# Patient Record
Sex: Male | Born: 1965
Health system: Southern US, Community
[De-identification: ages and names within clinical notes are randomized; demographics above are authoritative.]

## PROBLEM LIST (undated history)

## (undated) DIAGNOSIS — M1990 Unspecified osteoarthritis, unspecified site: Secondary | ICD-10-CM

## (undated) DIAGNOSIS — E119 Type 2 diabetes mellitus without complications: Secondary | ICD-10-CM

## (undated) HISTORY — PX: HYDROCELE EXCISION / REPAIR: SUR1145

## (undated) HISTORY — PX: HERNIA REPAIR: SHX51

## (undated) HISTORY — PX: FINGER SURGERY: SHX640

## (undated) HISTORY — PX: KNEE SURGERY: SHX244

---

## 1999-02-08 ENCOUNTER — Emergency Department (HOSPITAL_COMMUNITY): Admission: EM | Admit: 1999-02-08 | Discharge: 1999-02-08 | Payer: Self-pay | Admitting: Emergency Medicine

## 2005-09-27 ENCOUNTER — Ambulatory Visit: Payer: Self-pay | Admitting: Orthopedic Surgery

## 2006-05-30 ENCOUNTER — Ambulatory Visit (HOSPITAL_COMMUNITY): Admission: RE | Admit: 2006-05-30 | Discharge: 2006-05-30 | Payer: Self-pay | Admitting: Family Medicine

## 2018-10-19 ENCOUNTER — Encounter (HOSPITAL_COMMUNITY): Payer: Self-pay | Admitting: Emergency Medicine

## 2018-10-19 ENCOUNTER — Other Ambulatory Visit: Payer: Self-pay

## 2018-10-19 ENCOUNTER — Ambulatory Visit (HOSPITAL_COMMUNITY)
Admission: EM | Admit: 2018-10-19 | Discharge: 2018-10-19 | Disposition: A | Discharge status: Home / Self Care | Payer: Managed Care, Other (non HMO) | Attending: Family Medicine | Admitting: Family Medicine

## 2018-10-19 DIAGNOSIS — N390 Urinary tract infection, site not specified: Secondary | ICD-10-CM | POA: Insufficient documentation

## 2018-10-19 DIAGNOSIS — M1990 Unspecified osteoarthritis, unspecified site: Secondary | ICD-10-CM | POA: Insufficient documentation

## 2018-10-19 DIAGNOSIS — M546 Pain in thoracic spine: Secondary | ICD-10-CM | POA: Diagnosis not present

## 2018-10-19 DIAGNOSIS — F17200 Nicotine dependence, unspecified, uncomplicated: Secondary | ICD-10-CM | POA: Diagnosis not present

## 2018-10-19 DIAGNOSIS — Z79899 Other long term (current) drug therapy: Secondary | ICD-10-CM | POA: Insufficient documentation

## 2018-10-19 DIAGNOSIS — M549 Dorsalgia, unspecified: Secondary | ICD-10-CM

## 2018-10-19 HISTORY — DX: Unspecified osteoarthritis, unspecified site: M19.90

## 2018-10-19 LAB — POCT URINALYSIS DIP (DEVICE)
Glucose, UA: 100 mg/dL — AB
KETONES UR: NEGATIVE mg/dL
LEUKOCYTES UA: NEGATIVE
NITRITE: NEGATIVE
PH: 6 (ref 5.0–8.0)
Protein, ur: 100 mg/dL — AB
SPECIFIC GRAVITY, URINE: 1.025 (ref 1.005–1.030)
Urobilinogen, UA: 2 mg/dL — ABNORMAL HIGH (ref 0.0–1.0)

## 2018-10-19 MED ORDER — CEPHALEXIN 500 MG PO CAPS: 500.0000 mg | ORAL_CAPSULE | Freq: Four times a day (QID) | ORAL | 0 refills | Status: AC

## 2018-10-19 NOTE — ED Provider Notes (Signed)
[facility]    CSN: [id number] Arrival date & time: 10/19/18  1421     History   Chief Complaint Chief Complaint  Patient presents with  . Urinary Tract Infection    HPI Hunter King is a 52 y.o. male.   Hunter King presents with complaints of painful urination, body aches and back pain which started two days ago. Urinary urgency which causes some incontinence as unable to get to bathroom quickly enough. Darker urine but no obvious blood in urine. No abdominal pain. Temp has been in the 99's. Back aches after urination and does improve. History of back pain but this feels different. Has taken tylenol and gabapentin which have minimally helped. Burning to urethra after urination. Pain to back is 4/10, at times does hurt with movement. No new numbness or tingling. No saddle paresthesia. No radiation of pain. Denies any previous similar. Stopped taking his lasix at onset of symptoms to prevent further urgency.     ROS per HPI.      Past Medical History:  Diagnosis Date  . Arthritis     There are no active problems to display for this patient.   Past Surgical History:  Procedure Laterality Date  . FINGER SURGERY Right   . HERNIA REPAIR    . HYDROCELE EXCISION / REPAIR    . KNEE SURGERY Right        Home Medications    Prior to Admission medications   Medication Sig Start Date End Date Taking? Authorizing Provider  acetaminophen (TYLENOL) 325 MG tablet Take 650 mg by mouth every 6 (six) hours as needed.   Yes [provider]  furosemide (LASIX) 20 MG tablet Take 20 mg by mouth.   Yes [provider]  gabapentin (NEURONTIN) 100 MG capsule Take 100 mg by mouth 3 (three) times daily.   Yes [provider]  mupirocin ointment (BACTROBAN) 2 % Place 1 application into the nose 2 (two) times daily.   Yes [provider]  potassium chloride (KLOR-CON) 20 MEQ packet Take 40 mEq by mouth 2 (two) times daily.   Yes [provider]  cephALEXin (KEFLEX) 500 MG capsule Take 1 capsule (500 mg total) by mouth 4 (four) times daily for 7 days. 10/19/18 10/26/18  Georgetta Haber, NP    Family History Family History  Problem Relation Age of Onset  . Fibromyalgia Mother     Social History Social History   Tobacco Use  . Smoking status: Current Every Day Smoker  Substance Use Topics  . Alcohol use: Yes  . Drug use: Never     Allergies   Ibuprofen and Naproxen   Review of Systems Review of Systems   Physical Exam Triage Vital Signs ED Triage Vitals  Enc Vitals Group     BP 10/19/18 1558 (!) 174/90     Pulse Rate 10/19/18 1558 79     Resp 10/19/18 1558 18     Temp 10/19/18 1558 98.6 F (37 C)     Temp Source 10/19/18 1558 Oral     SpO2 10/19/18 1558 96 %     Weight --      Height --      Head Circumference --      Peak Flow --      Pain Score 10/19/18 1551 4     Pain Loc --      Pain Edu? --      Excl. in GC? --    No  data found.  Updated Vital Signs BP (!) 174/90 (BP Location: Right Arm) Comment: regular , forearm  Pulse 79   Temp 98.6 F (37 C) (Oral)   Resp 18   SpO2 96%    Physical Exam  Constitutional: He is oriented to person, place, and time. He appears well-developed and well-nourished.  Cardiovascular: Normal rate and regular rhythm.  Pulmonary/Chest: Effort normal and breath sounds normal.  Abdominal: Soft. There is no tenderness. There is no rigidity, no rebound, no guarding, no CVA tenderness, no tenderness at McBurney's point and negative Murphy's sign.  Denies scrotal redness, swelling, pain; denies sores or lesions  Musculoskeletal:       Lumbar back: He exhibits tenderness, bony tenderness and pain. He exhibits normal range of motion, no swelling, no edema, no deformity, no laceration, no spasm and normal pulse.       Back:  Proximal lumbar, distal thoracic back with generalized tenderness on palpation; limited exam due to patient body habitus; gross  sensation intact to lower extremities; no weakness to lower extremities  Neurological: He is alert and oriented to person, place, and time.  Skin: Skin is warm and dry.     UC Treatments / Results  Labs (all labs ordered are listed, but only abnormal results are displayed) Labs Reviewed  POCT URINALYSIS DIP (DEVICE) - Abnormal; Notable for the following components:      Result Value   Glucose, UA 100 (*)    Bilirubin Urine SMALL (*)    Hgb urine dipstick SMALL (*)    Protein, ur 100 (*)    Urobilinogen, UA 2.0 (*)    All other components within normal limits  URINE CULTURE    EKG None  Radiology No results found.  Procedures Procedures (including critical care time)  Medications Ordered in UC Medications - No data to display  Initial Impression / Assessment and Plan / UC Course  I have reviewed the triage vital signs and the nursing notes.  Pertinent labs & imaging results that were available during my care of the patient were reviewed by me and considered in my medical decision making (see chart for details).     Urinary symptoms, hgb in urine, no leuks or nitrite. Culture pending. Will treat at this time with keflex pending culture. Low suspicion for pyelonephritis at this time. Acute on chronic back pain. Encouraged close follow up for recheck with pcp. Return precautions provided. Patient verbalized understanding and agreeable to plan.   Final Clinical Impressions(s) / UC Diagnoses   Final diagnoses:  Lower urinary tract infectious disease  Mid back pain     Discharge Instructions     Complete course of antibiotics.  Drink plenty of water to empty bladder regularly. Avoid alcohol and caffeine as these may irritate the bladder.   I have sent your urine to be cultured to ensure appropriate treatment with antibiotics.  May continue with previously prescribed medications for back pain.  Please follow up with your primary care provider for recheck in the next  week.  If develop worsening of pain, fevers, vomiting, weakness, numbness or tingling to legs or otherwise worsening please return or go to the Er.    ED Prescriptions    Medication Sig Dispense Auth. Provider   cephALEXin (KEFLEX) 500 MG capsule Take 1 capsule (500 mg total) by mouth 4 (four) times daily for 7 days. 28 capsule Georgetta Haber, NP     Controlled Substance Prescriptions [place] Controlled Substance Registry consulted? Not Applicable  Georgetta Haber, NP 10/19/18 [phone]

## 2018-10-19 NOTE — ED Triage Notes (Signed)
Lower, right back soreness thought to be muscle soreness.  On Friday patient noticed pain with urination.  Pain worsened.

## 2018-10-19 NOTE — Discharge Instructions (Signed)
Complete course of antibiotics.  Drink plenty of water to empty bladder regularly. Avoid alcohol and caffeine as these may irritate the bladder.   I have sent your urine to be cultured to ensure appropriate treatment with antibiotics.  May continue with previously prescribed medications for back pain.  Please follow up with your primary care provider for recheck in the next week.  If develop worsening of pain, fevers, vomiting, weakness, numbness or tingling to legs or otherwise worsening please return or go to the Er.

## 2018-10-21 ENCOUNTER — Telehealth (HOSPITAL_COMMUNITY): Payer: Self-pay

## 2018-10-21 LAB — URINE CULTURE: Culture: 10000 — AB

## 2018-10-21 NOTE — Telephone Encounter (Signed)
Urine culture positive for E.coli. This was treated with Keflex at ucc visit. Patient called and made aware.

## 2018-12-29 DIAGNOSIS — Z1389 Encounter for screening for other disorder: Secondary | ICD-10-CM | POA: Diagnosis not present

## 2018-12-29 DIAGNOSIS — I890 Lymphedema, not elsewhere classified: Secondary | ICD-10-CM | POA: Diagnosis not present

## 2018-12-29 DIAGNOSIS — Z6841 Body Mass Index (BMI) 40.0 and over, adult: Secondary | ICD-10-CM | POA: Diagnosis not present

## 2018-12-29 DIAGNOSIS — E119 Type 2 diabetes mellitus without complications: Secondary | ICD-10-CM | POA: Diagnosis not present

## 2018-12-29 DIAGNOSIS — Z713 Dietary counseling and surveillance: Secondary | ICD-10-CM | POA: Diagnosis not present

## 2019-01-02 ENCOUNTER — Encounter: Payer: Self-pay | Admitting: Podiatry

## 2019-01-02 ENCOUNTER — Ambulatory Visit (INDEPENDENT_AMBULATORY_CARE_PROVIDER_SITE_OTHER): Payer: PRIVATE HEALTH INSURANCE | Admitting: Podiatry

## 2019-01-02 VITALS — BP 131/79 | HR 83 | Resp 16

## 2019-01-02 DIAGNOSIS — E1149 Type 2 diabetes mellitus with other diabetic neurological complication: Secondary | ICD-10-CM | POA: Diagnosis not present

## 2019-01-02 DIAGNOSIS — I890 Lymphedema, not elsewhere classified: Secondary | ICD-10-CM | POA: Diagnosis not present

## 2019-01-02 DIAGNOSIS — L853 Xerosis cutis: Secondary | ICD-10-CM | POA: Diagnosis not present

## 2019-01-02 NOTE — Progress Notes (Signed)
Subjective:    Patient ID: Hunter King, male    DOB: August 29, 1966, 53 y.o.   MRN: [id number]  HPI 53 year old male presents the office today just in diabetic shoes and for diabetic foot evaluation.  He states that he has an dry, cracking skin to the right heel which is been on over the last 10 years.  Denies any drainage or pus or increase in swelling.  He still had the same issue in the left side.  He also has chronic lymphedema on both legs.  He was seen his primary care physician but no significant treatment for his legs daily that his sister does.  He was on antibiotics previously for concerns of possible infection.  Has any history of open sores.  He is diabetic his last A1c was 6.9.   Review of Systems  All other systems reviewed and are negative.  Past Medical History:  Diagnosis Date  . Arthritis     Past Surgical History:  Procedure Laterality Date  . FINGER SURGERY Right   . HERNIA REPAIR    . HYDROCELE EXCISION / REPAIR    . KNEE SURGERY Right      Current Outpatient Medications:  .  acetaminophen (TYLENOL) 325 MG tablet, Take 650 mg by mouth every 6 (six) hours as needed., Disp: , Rfl:  .  furosemide (LASIX) 20 MG tablet, Take 20 mg by mouth., Disp: , Rfl:  .  furosemide (LASIX) 40 MG tablet, TK 1 T PO D, Disp: , Rfl:  .  gabapentin (NEURONTIN) 100 MG capsule, Take 100 mg by mouth 3 (three) times daily., Disp: , Rfl:  .  mupirocin ointment (BACTROBAN) 2 %, Place 1 application into the nose 2 (two) times daily., Disp: , Rfl:  .  potassium chloride (K-DUR) 10 MEQ tablet, TK 1 T PO D, Disp: , Rfl:  .  potassium chloride (KLOR-CON) 20 MEQ packet, Take 40 mEq by mouth 2 (two) times daily., Disp: , Rfl:  .  SSD 1 % cream, APPLY A 1/16 INCH THICK LAYER TO ENTIRE BUM AREA BID, Disp: , Rfl:   Allergies  Allergen Reactions  . Ibuprofen Swelling  . Naproxen Swelling         Objective:   Physical Exam  General: AAO x3, NAD  Dermatological: Skin lymphedema changes  are present the bilateral legs with the right side worse than left.  There is some superficial areas of skin breakdown but there is no erythema warmth there is no purulence but there is some clear drainage expressed.  There is no flexion crepitation or any malodor present.  Vascular: Dorsalis Pedis artery and Posterior Tibial artery pedal pulses are 1/4 bilateral with immedate capillary fill time. There is no pain with calf compression, swelling, warmth, erythema.   Neruologic: Grossly intact via light touch bilateral.Protective threshold with Semmes Wienstein monofilament intact to all pedal sites bilateral.  Musculoskeletal: No gross boney pedal deformities bilateral. No pain, crepitus, or limitation noted with foot and ankle range of motion bilateral. Muscular strength 5/5 in all groups tested bilateral.    Assessment & Plan:  53 year old male with chronic significant lymphedema bilaterally; 2 diabetes with neuropathy. -Treatment options discussed including all alternatives, risks, and complications -Etiology of symptoms were discussed -I do think that he needs to be referred to the lymphedema clinic in [place].  Referral placed today. Unna boot were applied today.  Precautions were advised on when to remove it.  Were to try to set up home health for  him as well. -Moisturizer daily to the feet.  Do not apply interdigitally. -He will benefit more from a custom shoe. -Follow-up with Raiford Nobleick or Betha in order to get measured for custom shoes.  I think that given the swelling he is in need this as opposed to a regular diabetic shoe.  Vivi BarrackMatthew R Wagoner DPM

## 2019-01-05 ENCOUNTER — Telehealth: Payer: Self-pay | Admitting: *Deleted

## 2019-01-05 DIAGNOSIS — R609 Edema, unspecified: Secondary | ICD-10-CM

## 2019-01-05 DIAGNOSIS — I890 Lymphedema, not elsewhere classified: Secondary | ICD-10-CM

## 2019-01-05 NOTE — Telephone Encounter (Signed)
[facility] - Renea Ee states they go to [place] and accept [employer]. Faxed orders to [facility].

## 2019-01-05 NOTE — Telephone Encounter (Signed)
-----   Message from Vivi Barrack, DPM sent at 01/02/2019 10:51 AM EST ----- Can you please order arterial and venous studies due to swelling Also home health care to do unna boots 3 times a week. Can we also put in a referral for the lymphedema clinic in [place]?   Thanks.

## 2019-01-05 NOTE — Telephone Encounter (Signed)
Faxed Lymphedema Therapy orders to [facility]. Seg Multi and Venous orders to [facility] through Epic orders.

## 2019-01-06 NOTE — Telephone Encounter (Signed)
done

## 2019-01-07 ENCOUNTER — Telehealth: Payer: Self-pay | Admitting: Podiatry

## 2019-01-07 NOTE — Telephone Encounter (Signed)
[facility]roseF[name] and [facility] copy to pt, noting that our office was unable to contact him by the currently listed phone number

## 2019-01-07 NOTE — Telephone Encounter (Signed)
Pt is supposed to have home health nurse coming out to change his Unna boot but due to his work schedule he is unable to schedule a time with the nurse. Pt states his sister has been changing the unna boot and would like to continue helping but the pt is unsure of where he can get more supplies. Please give pt a call.

## 2019-01-07 NOTE — Telephone Encounter (Signed)
Called pt's listed phone number and there was no ring or dial tone.

## 2019-01-07 NOTE — Telephone Encounter (Signed)
[employer] states has UNNA V with zinc for $11.48.

## 2019-01-15 ENCOUNTER — Other Ambulatory Visit: Payer: BLUE CROSS/BLUE SHIELD | Admitting: Orthotics

## 2019-01-15 ENCOUNTER — Ambulatory Visit: Payer: PRIVATE HEALTH INSURANCE | Admitting: Podiatry

## 2019-01-15 ENCOUNTER — Other Ambulatory Visit: Payer: PRIVATE HEALTH INSURANCE | Admitting: Orthotics

## 2019-01-15 ENCOUNTER — Ambulatory Visit (INDEPENDENT_AMBULATORY_CARE_PROVIDER_SITE_OTHER): Payer: BLUE CROSS/BLUE SHIELD | Admitting: Podiatry

## 2019-01-15 DIAGNOSIS — M2142 Flat foot [pes planus] (acquired), left foot: Secondary | ICD-10-CM | POA: Diagnosis not present

## 2019-01-15 DIAGNOSIS — I890 Lymphedema, not elsewhere classified: Secondary | ICD-10-CM

## 2019-01-15 DIAGNOSIS — E1149 Type 2 diabetes mellitus with other diabetic neurological complication: Secondary | ICD-10-CM | POA: Diagnosis not present

## 2019-01-15 DIAGNOSIS — M2141 Flat foot [pes planus] (acquired), right foot: Secondary | ICD-10-CM | POA: Diagnosis not present

## 2019-01-22 ENCOUNTER — Ambulatory Visit (HOSPITAL_COMMUNITY): Payer: BLUE CROSS/BLUE SHIELD | Attending: Podiatry | Admitting: Physical Therapy

## 2019-01-22 ENCOUNTER — Other Ambulatory Visit: Payer: Self-pay

## 2019-01-22 DIAGNOSIS — I890 Lymphedema, not elsewhere classified: Secondary | ICD-10-CM | POA: Diagnosis not present

## 2019-01-22 DIAGNOSIS — S81801S Unspecified open wound, right lower leg, sequela: Secondary | ICD-10-CM

## 2019-01-22 DIAGNOSIS — S81802S Unspecified open wound, left lower leg, sequela: Secondary | ICD-10-CM | POA: Insufficient documentation

## 2019-01-22 DIAGNOSIS — M79604 Pain in right leg: Secondary | ICD-10-CM | POA: Diagnosis not present

## 2019-01-22 DIAGNOSIS — M79605 Pain in left leg: Secondary | ICD-10-CM

## 2019-01-22 NOTE — Therapy (Signed)
[facility] [address] [place], [place], [address] Phone: [phone]   Fax:  [phone]  Physical Therapy Evaluation  Patient Details  Name: Myrna Blazerdward W Greenleaf MRN: [id number] Date of Birth: [birth year] Referring Provider (PT): Ovid CurdMatthew Wagoner   Encounter Date: 01/22/2019  PT End of Session - 01/22/19 1059    Visit Number  1    Number of Visits  30    Date for PT Re-Evaluation  04/02/19    Authorization - Visit Number  1    Authorization - Number of Visits  30    PT Start Time  0815    PT Stop Time  1020    PT Time Calculation (min)  125 min    Activity Tolerance  Patient tolerated treatment well    Behavior During Therapy  [facility] for tasks assessed/performed       Past Medical History:  Diagnosis Date  . Arthritis     Past Surgical History:  Procedure Laterality Date  . FINGER SURGERY Right   . HERNIA REPAIR    . HYDROCELE EXCISION / REPAIR    . KNEE SURGERY Right     There were no vitals filed for this visit.   Subjective Assessment - 01/22/19 0833    Subjective  PT states that he has had swelling in both of his legs for about 10  years, he states that it runs all the way thru her family.  He states that his legs had unbelievable sores on them; he started on unna boots on 01/02/2019 and noted a great difference.  He is now being referred to skilled therapy for lymphedema services.     Pertinent History  DM, back issues, Rt heel plantarfascitis     How long can you sit comfortably?  no problem     How long can you stand comfortably?  back limiting 10 minutes     How long can you walk comfortably?  back limited     Currently in Pain?  No/denies   Not from lymphedema; back issues and Rt heel we will not be looking at this at this time.          [facility] Assessment - 01/22/19 0001      Assessment   Medical Diagnosis  B LE Lymphedema    Referring Provider (PT)  Marga MelnickMatthew Wagnor    Onset Date/Surgical Date  --   chronic   Next MD Visit  not scheduled     Prior Therapy  unna boot       Precautions   Precautions  None      Restrictions   Weight Bearing Restrictions  No      Balance Screen   Has the patient fallen in the past 6 months  No    Has the patient had a decrease in activity level because of a fear of falling?   Yes    Is the patient reluctant to leave their home because of a fear of falling?   No      Home Public house managernvironment   Living Environment  Private residence      Prior Function   Level of Independence  Independent    Vocation  Full time employment    Vocation Requirements  sedentary       Cognition   Overall Cognitive Status  Within Functional Limits for tasks assessed      Observation/Other Assessments   Focus on Therapeutic Outcomes (FOTO)   --  life impact survey:  [id number]       LYMPHEDEMA/ONCOLOGY QUESTIONNAIRE - 01/22/19 0815      What other symptoms do you have   Are you Having Heaviness or Tightness  Yes    Are you having Pain  No    Are you having pitting edema  Yes    Body Site  LE    Is it Hard or Difficult finding clothes that fit  Yes      Lymphedema Stage   Stage  STAGE 3 ELEPHANTIASIS  (Pended)       Lymphedema Assessments   Lymphedema Assessments  Lower extremities  (Pended)       Right Lower Extremity Lymphedema   20 cm Proximal to Suprapatella  103.8 cm  (Pended)     10 cm Proximal to Suprapatella  100.5 cm  (Pended)     At Midpatella/Popliteal Crease  59.3 cm  (Pended)     30 cm Proximal to Floor at Lateral Plantar Foot  64.8 cm  (Pended)     20 cm Proximal to Floor at Lateral Plantar Foot  56.8 1  (Pended)     10 cm Proximal to Floor at Lateral Malleoli  43 cm  (Pended)     Circumference of ankle/heel  42 cm.  (Pended)     5 cm Proximal to 1st MTP Joint  30.5 cm  (Pended)     Across MTP Joint  27.8 cm  (Pended)     Around Proximal Great Toe  10.1 cm  (Pended)       Left Lower Extremity Lymphedema   20 cm Proximal to Suprapatella  104 cm  (Pended)     10  cm Proximal to Suprapatella  95 cm  (Pended)     At Midpatella/Popliteal Crease  55 cm  (Pended)     30 cm Proximal to Floor at Lateral Plantar Foot  54.5 cm  (Pended)     20 cm Proximal to Floor at Lateral Plantar Foot  47.4 cm  (Pended)     10 cm Proximal to Floor at Lateral Malleoli  38.8 cm  (Pended)     Circumference of ankle/heel  42 cm.  (Pended)     5 cm Proximal to 1st MTP Joint  28.5 cm  (Pended)     Across MTP Joint  26.8 cm  (Pended)     Around Proximal Great Toe  9.5 cm  (Pended)              Objective measurements completed on examination: See above findings.     Wound Therapy - 01/22/19 1044    Evaluation and Treatment Procedures Explained to Patient/Family  Yes    Evaluation and Treatment Procedures  agreed to    Wound Properties Date First Assessed: 01/22/19 Time First Assessed: 0930 Wound Type: Other (Comment) Location: Leg Location Orientation: Right;Anterior;Distal Present on Admission: Yes   Dressing Type  None    Dressing Changed  New    Dressing Status  None    Dressing Change Frequency  PRN    Site / Wound Assessment  Clean;Granulation tissue    % Wound base Red or Granulating  100%    Peri-wound Assessment  Edema;Excoriated;Induration    Wound Length (cm)  2.5 cm    Wound Width (cm)  2 cm    Wound Surface Area (cm^2)  5 cm^2    Drainage Amount  Minimal    Treatment  Cleansed;Other (Comment)   dressing and compression  bandaging    Wound Properties Date First Assessed: 01/22/19 Time First Assessed: 0949 Wound Type: Other (Comment) Location: Leg Location Orientation: Right;Anterior;Posterior Wound Description (Comments): wound wraps from lateral to posterior aspect  Present on Admission: Yes   Dressing Type  None    Dressing Changed  New    Dressing Status  None    Dressing Change Frequency  PRN    Site / Wound Assessment  Granulation tissue    % Wound base Red or Granulating  90%    % Wound base Yellow/Fibrinous Exudate  10%    Peri-wound  Assessment  Edema;Excoriated;Induration    Wound Length (cm)  7.5 cm    Wound Width (cm)  18 cm    Wound Surface Area (cm^2)  135 cm^2    Drainage Amount  Minimal    Treatment  Cleansed;Other (Comment)    Wound Properties Date First Assessed: 01/22/19 Time First Assessed: 0951 Wound Type: Other (Comment) Location Orientation: Left;Proximal;Anterior Present on Admission: Yes   Dressing Type  None    Dressing Changed  New    Dressing Status  None    Site / Wound Assessment  Dry;Granulation tissue    % Wound base Red or Granulating  80%    % Wound base Yellow/Fibrinous Exudate  20%    Peri-wound Assessment  Edema;Excoriated;Induration    Wound Length (cm)  15 cm    Wound Width (cm)  10.3 cm    Wound Surface Area (cm^2)  154.5 cm^2    Drainage Amount  Minimal    Treatment  Cleansed;Other (Comment)    Wound Properties Date First Assessed: 01/22/19 Time First Assessed: 0953 Wound Type: Other (Comment) Location: Leg Location Orientation: Right;Distal Present on Admission: Yes   Dressing Type  None    Dressing Changed  New    Dressing Status  None    Dressing Change Frequency  PRN    Site / Wound Assessment  Dry;Granulation tissue;Yellow    % Wound base Red or Granulating  80%    % Wound base Yellow/Fibrinous Exudate  20%    Peri-wound Assessment  Edema;Excoriated;Induration    Wound Length (cm)  8.5 cm    Wound Width (cm)  17 cm    Wound Surface Area (cm^2)  144.5 cm^2    Drainage Amount  Minimal    Treatment  Cleansed;Other (Comment)      [facility] Adult PT Treatment/Exercise - 01/22/19 0001      Manual Therapy   Manual Therapy  Manual Lymphatic Drainage (MLD);Compression Bandaging    Manual therapy comments  done seperate from all other aspects of treatment     Compression Bandaging  1/2" foam cut for Pt LE.  Pt then bandaged using foam and multilayer short stretch bandages.              PT Education - 01/22/19 1057    Education Details  Pt educated on lymphedema, the chronic  nature of lymphedema and the need to carry on maintenance routine once discharged from therapy.  Pt educated in skin care and LE exercises to increased lymphatic circulation.      Person(s) Educated  Patient;Other (comment)    Methods  Explanation;Demonstration;Handout    Comprehension  Verbalized understanding;Returned demonstration       PT Short Term Goals - 01/22/19 1116      PT SHORT TERM GOAL #1   Title  PT to be able to verbalize sign and sx of cellulitis and to realize that it is  important to seek immediate medical attention     Time  5    Period  Weeks    Target Date  02/26/19      PT SHORT TERM GOAL #2   Title  PT volume to be decreased by 5 cm in thigh, 3 in LE to allow pt to be able to don shoes and clothing easier.     Time  5    Period  Weeks    Status  New      PT SHORT TERM GOAL #3   Title   All Wounds to be healed to decrease the risk of infection     Time  5    Period  Weeks    Status  New        PT Long Term Goals - 01/22/19 1119      PT LONG TERM GOAL #1   Title  Thigh volume to be decreased by 10 / LE volume to be decreased by 4 to allow pt to don clothing with ease.     Time  10    Period  Weeks    Target Date  04/02/19      PT LONG TERM GOAL #2   Title  Pt to have received and to be using a compression pump.     Time  10    Period  Weeks    Status  New      PT LONG TERM GOAL #3   Title  PT to have recieved and to be able to don compression garment for maintenance phase of treatment.     Time  10    Period  Weeks    Status  New             Plan - 01/22/19 1102    Clinical Impression Statement  Mr. Peckham is a 53 yo male who states that he feels that he has had lymphedema for over 10 yrs.  He initially went to his primary MD who stated that he needed to wear compression socks but did not verbalize why, therefore he did not wear them.  Over the years he has had cellulitis, multiple wounds and has had surgery for his swelling but nobody  could tell him what was happening.  He does have primary lymphedema in his family history.  He states that he has not had pain in his legs until recently but the unnaboot that was recently placed on his legs has takent some of the edema down and has calmed the pain in his legs down.  He recently went to a new physician who has referred him to skilled physical therapy for lymphedema treatment.  Evaluation demonstrates multiple wounds on B LE as well as significant induration in B LE.  Mr. Lebleu will benefit from skilled physical therapy to resolve his wounds, decrease his fluid, educate pt in lymphedema and how it is managed.  An order was sent to the MD for compression pump as well as compression garment/hose to include foot and ankle.      History and Personal Factors relevant to plan of care:  chronic back pain, Rt plantar fascitis.     Clinical Presentation  Evolving    Clinical Decision Making  Moderate    Rehab Potential  Good    PT Frequency  3x / week    PT Duration  --   10 weeks    PT Treatment/Interventions  ADLs/Self Care Home Management;Therapeutic exercise;Patient/family education;Manual techniques;Manual  lymph drainage;Compression bandaging    PT Next Visit Plan  begin total decongestive techniques.  Cut foam for thighs and begin Thigh bandaging.  Measure mid week for volume and wound size.     PT Home Exercise Plan  ankle pumps, LAQ, hip ab/adduction, marching , diaphragmic breathing .     Consulted and Agree with Plan of Care  Patient       Patient will benefit from skilled therapeutic intervention in order to improve the following deficits and impairments:  Decreased activity tolerance, Decreased skin integrity, Decreased range of motion, Increased edema, Impaired flexibility, Pain, Obesity  Visit Diagnosis: Leg wound, left, sequela  Lymphedema, not elsewhere classified  Pain in right leg  Pain in left leg  Leg wound, right, sequela     Problem List There are no  active problems to display for this patient.   Virgina Organ, PT CLT [id number] 01/22/2019, 11:28 AM  [place] [facility] [address] [place], [place], [address] Phone: [phone]   Fax:  [phone]  Name: RONAV FURNEY MRN: [id number] Date of Birth: 1966/10/28

## 2019-01-26 ENCOUNTER — Telehealth (HOSPITAL_COMMUNITY): Payer: Self-pay | Admitting: Family Medicine

## 2019-01-26 NOTE — Progress Notes (Signed)
Subjective: 53 year old male presents the office today for follow-up evaluation of lymphedema bilaterally.  Since I last saw him his sister has been changing the dressings daily with Unna boots and he states he is pleased with the progress he is making so far.  He is scheduled to see Hunter King today for measurement of custom shoes.  Also has an upcoming appointment next week with the l[place]hed[place] clinic in [place].  He has no new concerns.Denies any systemic complaints such as fevers, chills, nausea, vomiting. No acute changes since last appointment, and no other complaints at this time.   Objective: AAO x3, NAD DP/PT pulses decreased bilaterally (likely from swelling), CRT less than 3 seconds There is chronic lymphedema changes present bilaterally however they appear to be much improved compared to last saw him.  There is no drainage or pus.  There is still a superficial areas of skin breakdown along the right side but there is no drainage of pus or any surrounding erythema, ascending cellulitis.  There is no fluctuation or crepitation malodor. Flatfoot present No pain with calf compression, swelling, warmth, erythema  Assessment: Bilateral lymphedema  Plan: -All treatment options discussed with the patient including all alternatives, risks, complications.  -New Unna boots were applied bilaterally today.  Patient to follow-up with the lymphedema clinic as well.  For now he is going to continue with every other day dressing changes and his sister is going to do them as she feels comfortable with this. Hunter King evaluated him today for measurement of custom shoes -Discussed the importance of daily foot inspection. -Patient encouraged to call the office with any questions, concerns, change in symptoms.   Hunter King DPM

## 2019-01-26 NOTE — Telephone Encounter (Signed)
01/26/19  his work schedule has changed to 10 hours and he can't come at the times listed and is working the same times we are open... he will have to call back when he is able to resume therapy

## 2019-01-27 ENCOUNTER — Ambulatory Visit (HOSPITAL_COMMUNITY): Payer: BLUE CROSS/BLUE SHIELD | Admitting: Physical Therapy

## 2019-01-28 ENCOUNTER — Ambulatory Visit (HOSPITAL_COMMUNITY): Payer: BLUE CROSS/BLUE SHIELD | Admitting: Physical Therapy

## 2019-01-30 ENCOUNTER — Encounter (HOSPITAL_COMMUNITY): Payer: BLUE CROSS/BLUE SHIELD | Admitting: Physical Therapy

## 2019-02-03 ENCOUNTER — Ambulatory Visit (HOSPITAL_COMMUNITY): Payer: BLUE CROSS/BLUE SHIELD | Admitting: Physical Therapy

## 2019-02-04 ENCOUNTER — Encounter

## 2019-02-05 ENCOUNTER — Ambulatory Visit (HOSPITAL_COMMUNITY): Payer: BLUE CROSS/BLUE SHIELD | Admitting: Physical Therapy

## 2019-02-06 ENCOUNTER — Encounter (HOSPITAL_COMMUNITY): Payer: BLUE CROSS/BLUE SHIELD | Admitting: Physical Therapy

## 2019-02-10 ENCOUNTER — Encounter (HOSPITAL_COMMUNITY): Payer: BLUE CROSS/BLUE SHIELD | Admitting: Physical Therapy

## 2019-02-11 ENCOUNTER — Encounter (HOSPITAL_COMMUNITY): Payer: BLUE CROSS/BLUE SHIELD | Admitting: Physical Therapy

## 2019-02-12 ENCOUNTER — Ambulatory Visit: Payer: PRIVATE HEALTH INSURANCE | Admitting: Podiatry

## 2019-02-13 ENCOUNTER — Encounter (HOSPITAL_COMMUNITY): Payer: BLUE CROSS/BLUE SHIELD | Admitting: Physical Therapy

## 2019-02-16 ENCOUNTER — Encounter (HOSPITAL_COMMUNITY): Payer: BLUE CROSS/BLUE SHIELD | Admitting: Physical Therapy

## 2019-02-18 ENCOUNTER — Encounter (HOSPITAL_COMMUNITY): Payer: BLUE CROSS/BLUE SHIELD | Admitting: Physical Therapy

## 2019-02-20 ENCOUNTER — Encounter (HOSPITAL_COMMUNITY): Payer: BLUE CROSS/BLUE SHIELD | Admitting: Physical Therapy

## 2019-02-23 ENCOUNTER — Encounter (HOSPITAL_COMMUNITY): Payer: BLUE CROSS/BLUE SHIELD | Admitting: Physical Therapy

## 2019-02-25 ENCOUNTER — Encounter (HOSPITAL_COMMUNITY): Payer: BLUE CROSS/BLUE SHIELD | Admitting: Physical Therapy

## 2019-02-27 ENCOUNTER — Encounter (HOSPITAL_COMMUNITY): Payer: BLUE CROSS/BLUE SHIELD | Admitting: Physical Therapy

## 2019-03-02 ENCOUNTER — Encounter (HOSPITAL_COMMUNITY): Payer: BLUE CROSS/BLUE SHIELD | Admitting: Physical Therapy

## 2019-03-03 ENCOUNTER — Other Ambulatory Visit: Payer: BLUE CROSS/BLUE SHIELD | Admitting: Orthotics

## 2019-03-03 ENCOUNTER — Ambulatory Visit (INDEPENDENT_AMBULATORY_CARE_PROVIDER_SITE_OTHER): Payer: BLUE CROSS/BLUE SHIELD | Admitting: Podiatry

## 2019-03-03 ENCOUNTER — Other Ambulatory Visit: Payer: Self-pay

## 2019-03-03 ENCOUNTER — Encounter: Payer: Self-pay | Admitting: Podiatry

## 2019-03-03 DIAGNOSIS — M2141 Flat foot [pes planus] (acquired), right foot: Secondary | ICD-10-CM

## 2019-03-03 DIAGNOSIS — E1149 Type 2 diabetes mellitus with other diabetic neurological complication: Secondary | ICD-10-CM

## 2019-03-03 DIAGNOSIS — L409 Psoriasis, unspecified: Secondary | ICD-10-CM

## 2019-03-03 DIAGNOSIS — M2142 Flat foot [pes planus] (acquired), left foot: Secondary | ICD-10-CM | POA: Diagnosis not present

## 2019-03-03 DIAGNOSIS — I890 Lymphedema, not elsewhere classified: Secondary | ICD-10-CM | POA: Diagnosis not present

## 2019-03-03 MED ORDER — MUPIROCIN 2 % EX OINT: 1.0000 "application " | TOPICAL_OINTMENT | Freq: Two times a day (BID) | CUTANEOUS | 4 refills | Status: DC

## 2019-03-03 MED ORDER — CLOBETASOL PROPIONATE 0.05 % EX OINT: 1.0000 "application " | TOPICAL_OINTMENT | Freq: Two times a day (BID) | CUTANEOUS | 0 refills | Status: DC

## 2019-03-04 ENCOUNTER — Encounter (HOSPITAL_COMMUNITY): Payer: BLUE CROSS/BLUE SHIELD | Admitting: Physical Therapy

## 2019-03-05 ENCOUNTER — Ambulatory Visit: Payer: PRIVATE HEALTH INSURANCE | Admitting: Podiatry

## 2019-03-06 ENCOUNTER — Encounter (HOSPITAL_COMMUNITY): Payer: BLUE CROSS/BLUE SHIELD | Admitting: Physical Therapy

## 2019-03-09 ENCOUNTER — Encounter (HOSPITAL_COMMUNITY): Payer: BLUE CROSS/BLUE SHIELD | Admitting: Physical Therapy

## 2019-03-09 NOTE — Progress Notes (Signed)
Subjective: 53 year old male presents the office to pick up his custom shoes.  Also he presents today for follow-up evaluation of lymphedema.  He was going lymphedema clinic however due to scheduling he has stopped going but his sister has been wrapping his legs who she is very well aware of how to do this as her daughter has had lymphedema since birth and she has been wrapping her daughter's legs.  Overall she states that the left foot is doing significantly better.  The right side still has swelling however it is improving.  There is a superficial wound on the lateral aspect but they have been keeping mupirocin ointment on.  Also been using clobetasol in the legs.  They are asking for refills of both medications.  Denies any systemic complaints such as fevers, chills, nausea, vomiting. No acute changes since last appointment, and no other complaints at this time.   Objective: AAO x3, NAD DP/PT pulses palpable bilaterally, CRT less than 3 seconds There is chronic lymphedema changes present bilaterally.  This is superficial area of skin breakdown the lateral aspect of leg but there is no purulence identified at this time point surrounding erythema, ascending cellulitis.  There is no fluctuation crepitation any malodor.  Red plaque-like appearance is present to both legs. No open lesions or pre-ulcerative lesions.  No pain with calf compression, swelling, warmth, erythema  Assessment: Lymphedema, psoriasis  Plan: -All treatment options discussed with the patient including all alternatives, risks, complications.  -I refilled mupirocin ointment for the right leg as well as clobetasol for the psoriasis.  Continue with compression wraps.  They also ordered bike shorts to help with the thighs.  We discussed pumps however I would like for the thigh swelling to come down some. -Custom shoes were dispensed today.  Break instructions were discussed.  They appear to be fitting well with any irritation.  Onto  the skin daily for any skin breakdown. -Patient encouraged to call the office with any questions, concerns, change in symptoms.   Vivi Barrack DPM

## 2019-03-11 ENCOUNTER — Encounter (HOSPITAL_COMMUNITY): Payer: BLUE CROSS/BLUE SHIELD | Admitting: Physical Therapy

## 2019-03-13 ENCOUNTER — Encounter (HOSPITAL_COMMUNITY): Payer: BLUE CROSS/BLUE SHIELD | Admitting: Physical Therapy

## 2019-03-13 DIAGNOSIS — J069 Acute upper respiratory infection, unspecified: Secondary | ICD-10-CM | POA: Diagnosis not present

## 2019-04-01 ENCOUNTER — Telehealth: Payer: Self-pay | Admitting: *Deleted

## 2019-04-01 ENCOUNTER — Other Ambulatory Visit: Payer: Self-pay | Admitting: Podiatry

## 2019-04-01 NOTE — Telephone Encounter (Signed)
Cancelled duplicate order Temovate.

## 2019-04-24 ENCOUNTER — Telehealth: Payer: Self-pay | Admitting: Podiatry

## 2019-04-24 NOTE — Telephone Encounter (Signed)
He was going to PT for the Lymphedema. Usually they order the pumps. If he does not want to come in we can do a video visit.

## 2019-04-24 NOTE — Telephone Encounter (Signed)
Pt is scheduled for appt 05/05/19 but called wanting to see if it was necessary for him to come in for the appt. The patient is coming in to discuss getting a pump to help with him lymphoedema. Can we get the patient a pump without another follow up appt? Please give pt a call.

## 2019-05-05 ENCOUNTER — Telehealth (INDEPENDENT_AMBULATORY_CARE_PROVIDER_SITE_OTHER): Payer: BLUE CROSS/BLUE SHIELD | Admitting: Podiatry

## 2019-05-05 DIAGNOSIS — E1149 Type 2 diabetes mellitus with other diabetic neurological complication: Secondary | ICD-10-CM

## 2019-05-05 DIAGNOSIS — I890 Lymphedema, not elsewhere classified: Secondary | ICD-10-CM

## 2019-05-05 DIAGNOSIS — R234 Changes in skin texture: Secondary | ICD-10-CM

## 2019-05-05 MED ORDER — MUPIROCIN 2 % EX OINT: 1.0000 "application " | TOPICAL_OINTMENT | Freq: Two times a day (BID) | CUTANEOUS | 4 refills | Status: DC

## 2019-05-05 NOTE — Progress Notes (Signed)
Virtual Visit via Video Note  I connected with Hunter King on 05/05/19 at  4:45 PM EDT by a video enabled telemedicine application and verified that I am speaking with the correct person using two identifiers.  Location: Patient: Home Provider: Office   I discussed the limitations of evaluation and management by telemedicine and the availability of in person appointments. The patient expressed understanding and agreed to proceed.  History of Present Illness: 53 year old male is longstanding history of lymphedema.  He states that he has been using a compression shorts which is been somewhat helpful.  Also continued with Unna boot wrappings and the swelling to his left leg is much better than his right leg.  He was using a family members lymphedema pump and this has been helpful. He is requesting his own pump. He has not been able to go back to the lymphedema clinic. He also states that he has an area on the bottom of his right heel.  He was getting some discomfort upon the heel he noticed a skin fissure.  The been keeping moisturizer on this area but denies any skin breakdown, drainage or pus.  He does not have a history of heart failure or other cardiac issues.   Observations/Objective: Patient is emailed me measurements of his legs.  See below.   I reviewed the measurements from the lymphedema clinic from January 22, 2019.  He states he still has a superficial area of skin breakdown but he has had some saw him on the right leg.  Is been keeping mupirocin ointment on the area daily and this is been helpful.  He is asking for refill today.  He denies any pus coming from the area but does get some occasional clear drainage.  No redness or red streaks.  He states that the psoriasis is doing well and does not need a refill of the clobetasol.  There is a wound of the plantar aspect the heel.  This appears to be a callus.  Young a picture of this today reviewed.  Does not appear to have any  drainage or positive redness.  Denies any fevers or chills.  No systemic symptoms.  Assessment and Plan: Lymphedema, skin fissure  At this time an order pumps for him.  There are measurements dated January 22, 2019 and the physical therapist in Epic I reviewed and also reviewed the measurements he sent me.  Hopefully we can get a pump for him.  Continue with wrappings for now.  Continue the antibiotic ointment/mupirocin ointment on the wound on the right side and continue to monitor for any signs or symptoms of infection.  Continue moisturizer on the heel daily.  Discussed that there is any skin breakdown or opening to needs to use antibiotic ointment. Follow Up Instructions: See above   I discussed the assessment and treatment plan with the patient. The patient was provided an opportunity to ask questions and all were answered. The patient agreed with the plan and demonstrated an understanding of the instructions.   The patient was advised to call back or seek an in-person evaluation if the symptoms worsen or if the condition fails to improve as anticipated.  I provided 18 minutes of non-face-to-face time during this encounter.   Vivi Barrack, DPM

## 2019-05-07 ENCOUNTER — Telehealth: Payer: Self-pay | Admitting: *Deleted

## 2019-05-07 DIAGNOSIS — R609 Edema, unspecified: Secondary | ICD-10-CM

## 2019-05-07 DIAGNOSIS — I890 Lymphedema, not elsewhere classified: Secondary | ICD-10-CM

## 2019-05-07 NOTE — Telephone Encounter (Signed)
-----   Message from Vivi Barrack, DPM sent at 05/07/2019  7:40 AM EDT ----- Can you please order lymphedema pumps for him? I have not ordered them in a long time and not sure who to go through. In my last note from Tuesday I included the measurements he sent me (we did an e-visit). In epic there is a PT note from February that has measurements as well. Hopefully that helps. Let me know what I can do to help facilitate this.

## 2019-05-07 NOTE — Telephone Encounter (Signed)
Faxed order for lymphedema pumps to [facility] with clinicals of 05/05/2019, and demographics.

## 2019-05-24 ENCOUNTER — Other Ambulatory Visit: Payer: Self-pay | Admitting: Podiatry

## 2019-05-28 DIAGNOSIS — I890 Lymphedema, not elsewhere classified: Secondary | ICD-10-CM | POA: Diagnosis not present

## 2019-06-25 DIAGNOSIS — M1991 Primary osteoarthritis, unspecified site: Secondary | ICD-10-CM | POA: Diagnosis not present

## 2019-06-25 DIAGNOSIS — F419 Anxiety disorder, unspecified: Secondary | ICD-10-CM | POA: Diagnosis not present

## 2019-06-25 DIAGNOSIS — D649 Anemia, unspecified: Secondary | ICD-10-CM | POA: Diagnosis not present

## 2019-06-25 DIAGNOSIS — E119 Type 2 diabetes mellitus without complications: Secondary | ICD-10-CM | POA: Diagnosis not present

## 2019-11-02 DIAGNOSIS — R531 Weakness: Secondary | ICD-10-CM | POA: Diagnosis not present

## 2019-11-02 DIAGNOSIS — W19XXXA Unspecified fall, initial encounter: Secondary | ICD-10-CM | POA: Diagnosis not present

## 2019-11-19 DIAGNOSIS — G8929 Other chronic pain: Secondary | ICD-10-CM | POA: Diagnosis not present

## 2019-11-19 DIAGNOSIS — I890 Lymphedema, not elsewhere classified: Secondary | ICD-10-CM | POA: Diagnosis not present

## 2019-11-19 DIAGNOSIS — F419 Anxiety disorder, unspecified: Secondary | ICD-10-CM | POA: Diagnosis not present

## 2019-11-19 DIAGNOSIS — M1991 Primary osteoarthritis, unspecified site: Secondary | ICD-10-CM | POA: Diagnosis not present

## 2019-11-20 ENCOUNTER — Encounter: Payer: Self-pay | Admitting: Podiatry

## 2019-11-20 ENCOUNTER — Telehealth (INDEPENDENT_AMBULATORY_CARE_PROVIDER_SITE_OTHER): Payer: BC Managed Care – PPO | Admitting: Podiatry

## 2019-11-20 DIAGNOSIS — I890 Lymphedema, not elsewhere classified: Secondary | ICD-10-CM

## 2019-11-20 DIAGNOSIS — E1149 Type 2 diabetes mellitus with other diabetic neurological complication: Secondary | ICD-10-CM | POA: Diagnosis not present

## 2019-11-20 DIAGNOSIS — L853 Xerosis cutis: Secondary | ICD-10-CM | POA: Diagnosis not present

## 2019-11-20 DIAGNOSIS — R609 Edema, unspecified: Secondary | ICD-10-CM

## 2019-11-20 MED ORDER — MUPIROCIN 2 % EX OINT: 1.0000 "application " | TOPICAL_OINTMENT | Freq: Two times a day (BID) | CUTANEOUS | 4 refills | Status: DC

## 2019-11-20 MED ORDER — CLOBETASOL PROPIONATE 0.05 % EX OINT: TOPICAL_OINTMENT | CUTANEOUS | 2 refills | Status: AC

## 2019-11-20 MED ORDER — UREA 40 % EX CREA: 1.0000 "application " | TOPICAL_CREAM | Freq: Every day | CUTANEOUS | 2 refills | Status: AC

## 2019-11-20 NOTE — Progress Notes (Signed)
Virtual Visit via Video Note  I connected with Hunter King on 11/20/2019 at  12:29pm PM EDT by a video enabled telemedicine application and verified that I am speaking with the correct person using two identifiers.  Location: Patient: Home Provider: Office  Due to connection issues the visit took place via Facetime on my personal phone.    I discussed the limitations of evaluation and management by telemedicine and the availability of in person appointments. The patient expressed understanding and agreed to proceed.  History of Present Illness: 53 year old male with longstanding history of lymphedema requesting appointment for to further discuss his treatments.  He states that since I last saw him they have continued to the pump for his legs however this caused significant swelling to his thighs which is severely limited his mobility.  Also due to the recent pandemic he has not left his house since March and this is causing both physical as well as mental issues.  He has seen his primary care physician and he has been started on antidepressant medication as well.  He gets pain to his legs given the swelling to his thighs and inability to walk.  He has reached out to a lymphedema specialist as well and they are restarting home physical therapy.  He does report a history of a fall that he talk to his primary care physician for.  EMS had to come out last week.  He states they did not do vital signs however when his wife checked his pulse ox it was in the mid 39s.  Has been rechecked and he is ranging the mid 80s to the low 90s.  His primary care physician is aware there will be doing blood work next week.  He currently denies any fevers chills or any chest pain, shortness of breath.  He does get out of breath for walking short distances as he is deconditioned.  He also states that he has been getting wart on the bottom of the right heel and he was using Compound W which helps some however they came  back.  He is also getting the callus on the outside aspect of his right foot and the way he shifts his weight.  He is also asking for refill the clobetasol as well as the mupirocin although he does not have any open wounds at this point he wants to have this medication on hand.   Observations/Objective: Upon my evaluation the swelling to the legs are much improved.  There is quite a bit of swelling to the thigh area.  On the plantar aspect of the right heel is a hyperkeratotic lesion.  This appears more of a callus as opposed to a verruca that I can see.  Also in the lateral aspect there is an area of a callus.  There is no skin breakdown identified to bilateral lower extremities there is no open lesions that I can see today.  Assessment and Plan: Lymphedema, skin fissure  He will be starting physical therapy at home next week and I think will be helpful for him.  He is quite deconditioned as well he has not been mobile the last 8 months.  Hopefully as he gets some better mobility this will help as well.  Continue with wrapping the legs as well that we discussed today.  I ordered urea cream for the callus skin.  Also refilled clobetasol as well as mupirocin ointment for him.  Monitor for any skin breakdown.  We will follow back up  in about 2 months or sooner if needed.    Follow Up Instructions: See above   I discussed the assessment and treatment plan with the patient. The patient was provided an opportunity to ask questions and all were answered. The patient agreed with the plan and demonstrated an understanding of the instructions.   The patient was advised to call back or seek an in-person evaluation if the symptoms worsen or if the condition fails to improve as anticipated.  I provided 20 minutes of non-face-to-face time during this encounter.   Vivi Barrack, DPM

## 2019-11-23 DIAGNOSIS — Z9181 History of falling: Secondary | ICD-10-CM | POA: Diagnosis not present

## 2019-11-23 DIAGNOSIS — L409 Psoriasis, unspecified: Secondary | ICD-10-CM | POA: Diagnosis not present

## 2019-11-23 DIAGNOSIS — I890 Lymphedema, not elsewhere classified: Secondary | ICD-10-CM | POA: Diagnosis not present

## 2019-11-23 DIAGNOSIS — E1149 Type 2 diabetes mellitus with other diabetic neurological complication: Secondary | ICD-10-CM | POA: Diagnosis not present

## 2019-11-23 DIAGNOSIS — M2142 Flat foot [pes planus] (acquired), left foot: Secondary | ICD-10-CM | POA: Diagnosis not present

## 2019-11-23 DIAGNOSIS — R262 Difficulty in walking, not elsewhere classified: Secondary | ICD-10-CM | POA: Diagnosis not present

## 2019-11-23 DIAGNOSIS — M2141 Flat foot [pes planus] (acquired), right foot: Secondary | ICD-10-CM | POA: Diagnosis not present

## 2019-11-25 DIAGNOSIS — Z9181 History of falling: Secondary | ICD-10-CM | POA: Diagnosis not present

## 2019-11-25 DIAGNOSIS — L409 Psoriasis, unspecified: Secondary | ICD-10-CM | POA: Diagnosis not present

## 2019-11-25 DIAGNOSIS — I890 Lymphedema, not elsewhere classified: Secondary | ICD-10-CM | POA: Diagnosis not present

## 2019-11-25 DIAGNOSIS — M2142 Flat foot [pes planus] (acquired), left foot: Secondary | ICD-10-CM | POA: Diagnosis not present

## 2019-11-25 DIAGNOSIS — E1149 Type 2 diabetes mellitus with other diabetic neurological complication: Secondary | ICD-10-CM | POA: Diagnosis not present

## 2019-11-25 DIAGNOSIS — M2141 Flat foot [pes planus] (acquired), right foot: Secondary | ICD-10-CM | POA: Diagnosis not present

## 2019-11-25 DIAGNOSIS — R262 Difficulty in walking, not elsewhere classified: Secondary | ICD-10-CM | POA: Diagnosis not present

## 2019-11-26 DIAGNOSIS — R262 Difficulty in walking, not elsewhere classified: Secondary | ICD-10-CM | POA: Diagnosis not present

## 2019-11-26 DIAGNOSIS — L409 Psoriasis, unspecified: Secondary | ICD-10-CM | POA: Diagnosis not present

## 2019-11-26 DIAGNOSIS — M2142 Flat foot [pes planus] (acquired), left foot: Secondary | ICD-10-CM | POA: Diagnosis not present

## 2019-11-26 DIAGNOSIS — I890 Lymphedema, not elsewhere classified: Secondary | ICD-10-CM | POA: Diagnosis not present

## 2019-11-26 DIAGNOSIS — E1149 Type 2 diabetes mellitus with other diabetic neurological complication: Secondary | ICD-10-CM | POA: Diagnosis not present

## 2019-11-26 DIAGNOSIS — M2141 Flat foot [pes planus] (acquired), right foot: Secondary | ICD-10-CM | POA: Diagnosis not present

## 2019-11-26 DIAGNOSIS — Z9181 History of falling: Secondary | ICD-10-CM | POA: Diagnosis not present

## 2019-11-27 DIAGNOSIS — R262 Difficulty in walking, not elsewhere classified: Secondary | ICD-10-CM | POA: Diagnosis not present

## 2019-11-27 DIAGNOSIS — L409 Psoriasis, unspecified: Secondary | ICD-10-CM | POA: Diagnosis not present

## 2019-11-27 DIAGNOSIS — M2142 Flat foot [pes planus] (acquired), left foot: Secondary | ICD-10-CM | POA: Diagnosis not present

## 2019-11-27 DIAGNOSIS — E1149 Type 2 diabetes mellitus with other diabetic neurological complication: Secondary | ICD-10-CM | POA: Diagnosis not present

## 2019-11-27 DIAGNOSIS — F4001 Agoraphobia with panic disorder: Secondary | ICD-10-CM | POA: Diagnosis not present

## 2019-11-27 DIAGNOSIS — I890 Lymphedema, not elsewhere classified: Secondary | ICD-10-CM | POA: Diagnosis not present

## 2019-11-27 DIAGNOSIS — Z9181 History of falling: Secondary | ICD-10-CM | POA: Diagnosis not present

## 2019-11-27 DIAGNOSIS — M2141 Flat foot [pes planus] (acquired), right foot: Secondary | ICD-10-CM | POA: Diagnosis not present

## 2019-12-01 DIAGNOSIS — Z9181 History of falling: Secondary | ICD-10-CM | POA: Diagnosis not present

## 2019-12-01 DIAGNOSIS — R262 Difficulty in walking, not elsewhere classified: Secondary | ICD-10-CM | POA: Diagnosis not present

## 2019-12-01 DIAGNOSIS — M2142 Flat foot [pes planus] (acquired), left foot: Secondary | ICD-10-CM | POA: Diagnosis not present

## 2019-12-01 DIAGNOSIS — M2141 Flat foot [pes planus] (acquired), right foot: Secondary | ICD-10-CM | POA: Diagnosis not present

## 2019-12-01 DIAGNOSIS — I890 Lymphedema, not elsewhere classified: Secondary | ICD-10-CM | POA: Diagnosis not present

## 2019-12-01 DIAGNOSIS — L409 Psoriasis, unspecified: Secondary | ICD-10-CM | POA: Diagnosis not present

## 2019-12-01 DIAGNOSIS — E1149 Type 2 diabetes mellitus with other diabetic neurological complication: Secondary | ICD-10-CM | POA: Diagnosis not present

## 2019-12-03 DIAGNOSIS — M2142 Flat foot [pes planus] (acquired), left foot: Secondary | ICD-10-CM | POA: Diagnosis not present

## 2019-12-03 DIAGNOSIS — I890 Lymphedema, not elsewhere classified: Secondary | ICD-10-CM | POA: Diagnosis not present

## 2019-12-03 DIAGNOSIS — M2141 Flat foot [pes planus] (acquired), right foot: Secondary | ICD-10-CM | POA: Diagnosis not present

## 2019-12-03 DIAGNOSIS — Z9181 History of falling: Secondary | ICD-10-CM | POA: Diagnosis not present

## 2019-12-03 DIAGNOSIS — R262 Difficulty in walking, not elsewhere classified: Secondary | ICD-10-CM | POA: Diagnosis not present

## 2019-12-03 DIAGNOSIS — E1149 Type 2 diabetes mellitus with other diabetic neurological complication: Secondary | ICD-10-CM | POA: Diagnosis not present

## 2019-12-03 DIAGNOSIS — L409 Psoriasis, unspecified: Secondary | ICD-10-CM | POA: Diagnosis not present

## 2019-12-04 DIAGNOSIS — R262 Difficulty in walking, not elsewhere classified: Secondary | ICD-10-CM | POA: Diagnosis not present

## 2019-12-04 DIAGNOSIS — Z9181 History of falling: Secondary | ICD-10-CM | POA: Diagnosis not present

## 2019-12-04 DIAGNOSIS — E1149 Type 2 diabetes mellitus with other diabetic neurological complication: Secondary | ICD-10-CM | POA: Diagnosis not present

## 2019-12-04 DIAGNOSIS — M2141 Flat foot [pes planus] (acquired), right foot: Secondary | ICD-10-CM | POA: Diagnosis not present

## 2019-12-04 DIAGNOSIS — M2142 Flat foot [pes planus] (acquired), left foot: Secondary | ICD-10-CM | POA: Diagnosis not present

## 2019-12-04 DIAGNOSIS — I890 Lymphedema, not elsewhere classified: Secondary | ICD-10-CM | POA: Diagnosis not present

## 2019-12-04 DIAGNOSIS — L409 Psoriasis, unspecified: Secondary | ICD-10-CM | POA: Diagnosis not present

## 2019-12-07 DIAGNOSIS — R262 Difficulty in walking, not elsewhere classified: Secondary | ICD-10-CM | POA: Diagnosis not present

## 2019-12-07 DIAGNOSIS — Z9181 History of falling: Secondary | ICD-10-CM | POA: Diagnosis not present

## 2019-12-07 DIAGNOSIS — E1149 Type 2 diabetes mellitus with other diabetic neurological complication: Secondary | ICD-10-CM | POA: Diagnosis not present

## 2019-12-07 DIAGNOSIS — M2142 Flat foot [pes planus] (acquired), left foot: Secondary | ICD-10-CM | POA: Diagnosis not present

## 2019-12-07 DIAGNOSIS — I890 Lymphedema, not elsewhere classified: Secondary | ICD-10-CM | POA: Diagnosis not present

## 2019-12-07 DIAGNOSIS — L409 Psoriasis, unspecified: Secondary | ICD-10-CM | POA: Diagnosis not present

## 2019-12-07 DIAGNOSIS — M2141 Flat foot [pes planus] (acquired), right foot: Secondary | ICD-10-CM | POA: Diagnosis not present

## 2019-12-08 DIAGNOSIS — I890 Lymphedema, not elsewhere classified: Secondary | ICD-10-CM | POA: Diagnosis not present

## 2019-12-08 DIAGNOSIS — Z9181 History of falling: Secondary | ICD-10-CM | POA: Diagnosis not present

## 2019-12-08 DIAGNOSIS — M2141 Flat foot [pes planus] (acquired), right foot: Secondary | ICD-10-CM | POA: Diagnosis not present

## 2019-12-08 DIAGNOSIS — M2142 Flat foot [pes planus] (acquired), left foot: Secondary | ICD-10-CM | POA: Diagnosis not present

## 2019-12-08 DIAGNOSIS — L409 Psoriasis, unspecified: Secondary | ICD-10-CM | POA: Diagnosis not present

## 2019-12-08 DIAGNOSIS — R262 Difficulty in walking, not elsewhere classified: Secondary | ICD-10-CM | POA: Diagnosis not present

## 2019-12-08 DIAGNOSIS — E1149 Type 2 diabetes mellitus with other diabetic neurological complication: Secondary | ICD-10-CM | POA: Diagnosis not present

## 2019-12-17 DIAGNOSIS — F329 Major depressive disorder, single episode, unspecified: Secondary | ICD-10-CM | POA: Diagnosis not present

## 2019-12-30 DIAGNOSIS — I890 Lymphedema, not elsewhere classified: Secondary | ICD-10-CM | POA: Diagnosis not present

## 2019-12-30 DIAGNOSIS — L409 Psoriasis, unspecified: Secondary | ICD-10-CM | POA: Diagnosis not present

## 2019-12-30 DIAGNOSIS — M2141 Flat foot [pes planus] (acquired), right foot: Secondary | ICD-10-CM | POA: Diagnosis not present

## 2019-12-30 DIAGNOSIS — R262 Difficulty in walking, not elsewhere classified: Secondary | ICD-10-CM | POA: Diagnosis not present

## 2020-04-27 DIAGNOSIS — G894 Chronic pain syndrome: Secondary | ICD-10-CM | POA: Diagnosis not present

## 2020-04-27 DIAGNOSIS — F419 Anxiety disorder, unspecified: Secondary | ICD-10-CM | POA: Diagnosis not present

## 2020-04-27 DIAGNOSIS — I890 Lymphedema, not elsewhere classified: Secondary | ICD-10-CM | POA: Diagnosis not present

## 2020-04-27 DIAGNOSIS — M1991 Primary osteoarthritis, unspecified site: Secondary | ICD-10-CM | POA: Diagnosis not present

## 2020-05-19 ENCOUNTER — Other Ambulatory Visit: Payer: Self-pay | Admitting: *Deleted

## 2020-05-19 MED ORDER — MUPIROCIN 2 % EX OINT: 1.0000 "application " | TOPICAL_OINTMENT | Freq: Two times a day (BID) | CUTANEOUS | 4 refills | Status: AC

## 2020-05-19 NOTE — Telephone Encounter (Signed)
Refilled the mupirocin 2% ointment today. Hunter King

## 2020-06-03 DIAGNOSIS — D649 Anemia, unspecified: Secondary | ICD-10-CM | POA: Diagnosis not present

## 2020-06-03 DIAGNOSIS — N182 Chronic kidney disease, stage 2 (mild): Secondary | ICD-10-CM | POA: Diagnosis not present

## 2020-06-03 DIAGNOSIS — F329 Major depressive disorder, single episode, unspecified: Secondary | ICD-10-CM | POA: Diagnosis not present

## 2020-06-03 DIAGNOSIS — G894 Chronic pain syndrome: Secondary | ICD-10-CM | POA: Diagnosis not present

## 2020-06-24 DIAGNOSIS — G894 Chronic pain syndrome: Secondary | ICD-10-CM | POA: Diagnosis not present

## 2020-06-24 DIAGNOSIS — F4000 Agoraphobia, unspecified: Secondary | ICD-10-CM | POA: Diagnosis not present

## 2020-06-24 DIAGNOSIS — F419 Anxiety disorder, unspecified: Secondary | ICD-10-CM | POA: Diagnosis not present

## 2021-07-25 ENCOUNTER — Encounter (HOSPITAL_COMMUNITY): Payer: Self-pay | Admitting: Emergency Medicine

## 2021-07-25 ENCOUNTER — Emergency Department (HOSPITAL_COMMUNITY): Payer: No Typology Code available for payment source

## 2021-07-25 ENCOUNTER — Emergency Department (HOSPITAL_COMMUNITY)
Admission: EM | Admit: 2021-07-25 | Discharge: 2021-07-26 | Disposition: A | Discharge status: Home / Self Care | Payer: No Typology Code available for payment source | Source: Home / Self Care | Attending: Emergency Medicine | Admitting: Emergency Medicine

## 2021-07-25 ENCOUNTER — Other Ambulatory Visit: Payer: Self-pay

## 2021-07-25 DIAGNOSIS — R600 Localized edema: Secondary | ICD-10-CM | POA: Insufficient documentation

## 2021-07-25 DIAGNOSIS — Y9301 Activity, walking, marching and hiking: Secondary | ICD-10-CM | POA: Insufficient documentation

## 2021-07-25 DIAGNOSIS — Z87891 Personal history of nicotine dependence: Secondary | ICD-10-CM | POA: Insufficient documentation

## 2021-07-25 DIAGNOSIS — W19XXXA Unspecified fall, initial encounter: Secondary | ICD-10-CM

## 2021-07-25 DIAGNOSIS — E119 Type 2 diabetes mellitus without complications: Secondary | ICD-10-CM | POA: Insufficient documentation

## 2021-07-25 DIAGNOSIS — H5789 Other specified disorders of eye and adnexa: Secondary | ICD-10-CM | POA: Insufficient documentation

## 2021-07-25 DIAGNOSIS — M25562 Pain in left knee: Secondary | ICD-10-CM | POA: Insufficient documentation

## 2021-07-25 DIAGNOSIS — W010XXA Fall on same level from slipping, tripping and stumbling without subsequent striking against object, initial encounter: Secondary | ICD-10-CM | POA: Insufficient documentation

## 2021-07-25 DIAGNOSIS — R55 Syncope and collapse: Secondary | ICD-10-CM | POA: Diagnosis not present

## 2021-07-25 DIAGNOSIS — I5033 Acute on chronic diastolic (congestive) heart failure: Secondary | ICD-10-CM | POA: Diagnosis not present

## 2021-07-25 HISTORY — DX: Type 2 diabetes mellitus without complications: E11.9

## 2021-07-25 HISTORY — DX: Morbid (severe) obesity due to excess calories: E66.01

## 2021-07-25 MED ORDER — LORATADINE 10 MG PO TABS: 10.0000 mg | ORAL_TABLET | Freq: Every day | ORAL | 0 refills | Status: AC

## 2021-07-25 NOTE — Discharge Instructions (Addendum)
Imaging looks reassuring suspect you have strained your knee, recommend over-the-counter pain medications keeping the leg elevated, and applying ice to the area.  Suspect the redness in your eye Is likely due to allergies starting you on Claritin please take as prescribed.  Please follow-up with PCP for further evaluation.  Come back to the emergency department if you develop chest pain, shortness of breath, severe abdominal pain, uncontrolled nausea, vomiting, diarrhea.

## 2021-07-25 NOTE — ED Notes (Signed)
Pt updated on plan of care and told transport not available til tomorrow am. Pt given food and beverage

## 2021-07-25 NOTE — ED Provider Notes (Addendum)
[facility] EMERGENCY DEPARTMENT Provider Note   CSN: [id number] Arrival date & time: 07/25/21  0831     History Chief Complaint  Patient presents with   Hunter King is a 55 y.o. male.  HPI  Patient with significant medical history of arthritis, diabetes, obesity presents with chief complaint of a fall.  Patient states today he was in his home walking to his bathroom felt as if his left leg gave out causing him to fall onto his left knee.  He denies hitting his head, losing conscious, is not on anticoagulant he denies any neck, back pain, chest pain, shortness of breath, denies any abdominal pain, he states he has left knee pain, states it hurts with movement or ambulation, he denies pelvic pain.  He states that his left knee has given out in the past, states this is normal for him, he did have some paresthesias in his lower extremities but this has since resolved, he has no other complaints at this time.  Does not endorse fevers, chills, chest pain, shortness of breath, abdominal pain, worsening pedal edema, calf pain.  Past Medical History:  Diagnosis Date   Arthritis    Diabetes mellitus without complication (HCC)    Obesities, morbid (HCC)     There are no problems to display for this patient.   Past Surgical History:  Procedure Laterality Date   FINGER SURGERY Right    HERNIA REPAIR     HYDROCELE EXCISION / REPAIR     KNEE SURGERY Right        Family History  Problem Relation Age of Onset   Fibromyalgia Mother     Social History   Tobacco Use   Smoking status: Former    Types: Cigarettes   Smokeless tobacco: Never  Substance Use Topics   Alcohol use: Yes    Comment: socially on weekends   Drug use: Not Currently    Home Medications Prior to Admission medications   Medication Sig Start Date End Date Taking? Authorizing Provider  acetaminophen (TYLENOL) 325 MG tablet Take 650 mg by mouth every 6 (six) hours as needed.   Yes [provider]  furosemide (LASIX) 40 MG tablet Take 40 mg by mouth daily. 12/23/18  Yes [provider]  loratadine (CLARITIN) 10 MG tablet Take 1 tablet (10 mg total) by mouth daily. 07/25/21 08/24/21 Yes Carroll Sage, PA-C  mirtazapine (REMERON) 15 MG tablet Take 15 mg by mouth at bedtime as needed. 07/10/21  Yes [provider]  potassium chloride (K-DUR) 10 MEQ tablet Take 10 mEq by mouth daily. 11/27/18  Yes [provider]  sertraline (ZOLOFT) 100 MG tablet Take 150 mg by mouth daily. 06/28/21  Yes [provider]  clobetasol ointment (TEMOVATE) 0.05 % APPLY EXTERNALLY TO THE AFFECTED AREA TWICE DAILY Patient not taking: No sig reported 11/20/19   Vivi Barrack, DPM  mupirocin ointment (BACTROBAN) 2 % Apply 1 application topically 2 (two) times daily. Patient not taking: No sig reported 05/19/20   Vivi Barrack, DPM  urea (CARMOL) 40 % CREA Apply 1 application topically daily. To callus areas on bottom of feet Patient not taking: No sig reported 11/20/19   Vivi Barrack, DPM    Allergies    Ibuprofen, Naproxen, and Nsaids  Review of Systems   Review of Systems  Constitutional:  Negative for chills and fever.  HENT:  Negative for congestion.   Respiratory:  Negative for shortness of  breath.   Cardiovascular:  Negative for chest pain.  Gastrointestinal:  Negative for abdominal pain.  Genitourinary:  Negative for enuresis.  Musculoskeletal:  Negative for back pain.       Left knee pain.  Skin:  Negative for rash.  Neurological:  Negative for dizziness.  Hematological:  Does not bruise/bleed easily.   Physical Exam Updated Vital Signs BP 133/71   Pulse (!) 54   Temp 97.9 F (36.6 C) (Oral)   Resp 18   Ht 6\' 2"  (1.88 m)   Wt (!) 294.8 kg   SpO2 (!) 87%   BMI 83.46 kg/m   Physical Exam Vitals and nursing note reviewed.  Constitutional:      General: He is not in acute distress.    Appearance: He is obese. He is not  ill-appearing.  HENT:     Head: Normocephalic and atraumatic.     Nose: No congestion.  Eyes:     General:        Right eye: Discharge present.     Conjunctiva/sclera: Conjunctivae normal.     Comments: Patient's right eye was visualized he has slight sclera injection with some drainage, PERRLA, EOMs intact, no ulcer, no dendritic lesions, no surrounding erythema or edema around the periorbital region  Cardiovascular:     Rate and Rhythm: Normal rate and regular rhythm.     Pulses: Normal pulses.     Heart sounds: No murmur heard.   No friction rub. No gallop.  Pulmonary:     Effort: No respiratory distress.     Breath sounds: No wheezing, rhonchi or rales.  Chest:     Chest wall: No tenderness.  Abdominal:     Palpations: Abdomen is soft.     Tenderness: There is no abdominal tenderness. There is no right CVA tenderness or left CVA tenderness.  Musculoskeletal:     Right lower leg: Edema present.     Left lower leg: Edema present.     Comments: Due to body habitus unable to examine back.  Patient has full range of motion 5 of 5 strength in the upper and lower extremities, neurovascular fully intact.  Patient's left knee was evaluated there is no gross abnormalities present, he was slightly tender to palpation on the medial aspect of his tibial plateau no crepitus or deformities noted, no joint laxity.  Right leg slightly larger than left leg.  Patient has 2+ pitting edema up to the shins.  Skin:    General: Skin is warm and dry.     Capillary Refill: Capillary refill takes less than 2 seconds.     Comments: Patient has noted chronic lymphedema changes in his legs bilaterally, slight skin breakdown noted on the posterior aspect of the calfs bilaterally no noted pressure ulcers, no signs of infection present my exam.  Neurological:     Mental Status: He is alert.     Comments: No facial asymmetry, no difficulty word finding, no slurring of his words, able to follow two-step commands,  no unilateral weakness present.  Psychiatric:        Mood and Affect: Mood normal.    ED Results / Procedures / Treatments   Labs (all labs ordered are listed, but only abnormal results are displayed) Labs Reviewed - No data to display  EKG None  Radiology DG Knee Complete 4 Views Left  Result Date: 07/25/2021 CLINICAL DATA:  Fall EXAM: LEFT KNEE - COMPLETE 4+ VIEW COMPARISON:  Knee radiographs 05/30/2006 FINDINGS: Study quality is  degraded due to suboptimal patient positioning particularly on the lateral projection. There is no acute fracture or dislocation. Alignment is normal on the AP images. There is moderate medial and mild lateral tibiofemoral joint space narrowing with associated subchondral sclerosis and osteophytosis. A suprasellar effusion cannot be excluded due to positioning. IMPRESSION: No fracture or dislocation, within the confines of suboptimal patient positioning. Electronically Signed   By: Lesia Hausen MD   On: 07/25/2021 10:46    Procedures Procedures   Medications Ordered in ED Medications - No data to display  ED Course  I have reviewed the triage vital signs and the nursing notes.  Pertinent labs & imaging results that were available during my care of the patient were reviewed by me and considered in my medical decision making (see chart for details).    MDM Rules/Calculators/A&P                          Initial impression-patient presents after a fall.  He is alert, does not appear in acute stress, vital signs reassuring.We will obtain imaging of knee for further evaluation.  Work-up-DG of left knee is negative for acute findings.  Rule out- low suspicion for intracranial head bleed as patient denies loss of conscious, is not on anticoagulant, he does not endorse headaches, paresthesia/weakness in the upper and lower extremities, no focal deficits present on my exam.  Low suspicion for spinal cord abnormality or spinal fracture spine patient denies any  spine pain, patient has full range of motion in the upper and lower extremities.  Low suspicion for pneumothorax as lung sounds are clear bilaterally, chest nontender to palpation will defer imaging at this time.   Low suspicion for intra-abdominal trauma as abdomen soft nontender to palpation.  Low suspicion for orthopedic injury as imaging is negative for acute findings.  Low suspicion for DVT as patient has no calf pain, no erythema noted on exam.  Patient's right leg is slightly larger than the left leg but patient states this is chronic I suspect this is secondary due to venous insufficiency.  I have low suspicion for bacterial or viral conjunctivitis as presentation is atypical patient is having this for approximately 6 weeks, denies any pain, change in vision, recent trauma to the thigh.  Low suspicion for abrasion as he denies any pain with eye movement denies recent trauma to the area.  Denies orbital cellulitis there is no proptosis no pain with eye movements.   Plan-  Left knee pain suspect this is a muscular strain will recommend over-the-counter pain medications, follow-up with PCP as needed. Left eye redness-suspect secondary due to allergies, will start her on H1 H2 blockers follow-up with PCP for further evaluation.  Vital signs have remained stable, no indication for hospital admission.  Patient discussed with attending and they agreed with assessment and plan.  Patient given at home care as well strict return precautions.  Patient verbalized that they understood agreed to said plan.  Addendum-patient was discharged but I was notified by nursing staff that patient endorsed to them that he does not want to go home as he feels that he is going to die there.  He would rather be placed in a rehab.  I went out and spoke with the patient personally he states that he would like to go to a nursing facility as he feels weak and needs to help get his strength back.  Fortunately there is nothing  medically to admit  the patient for, this appears to be more a chronic issue, he has family member that are willing to take care of him as well as wound care which she is having 2 times weekly.  We will have social work speak with the patient.  Spoke with case management, they are unable to get him into a nursing facility, patient currently has home and health seeing him, they will order patient a bariatric walker as well as commode, recommend that he follows up with PCP.  Final Clinical Impression(s) / ED Diagnoses Final diagnoses:  Fall, initial encounter  Acute pain of left knee    Rx / DC Orders ED Discharge Orders          Ordered    loratadine (CLARITIN) 10 MG tablet  Daily        07/25/21 1106             Carroll SageFaulkner, Avnoor Koury J, PA-C 07/25/21 1108    Carroll SageFaulkner, Rafia Shedden J, PA-C 07/25/21 1511    Pollyann SavoySheldon, Charles B, MD 07/26/21 1106

## 2021-07-25 NOTE — ED Notes (Signed)
Pt discharged, pt reports he is unable to ambulate and will need an EMS ride home; dressings placed bilaterally to lower extremities

## 2021-07-25 NOTE — Progress Notes (Signed)
CSW made aware that pt was requesting to speak with social worker about placement. CSW spoke with pt about pt being medically cleared and ready for d/c. CSW explained that if pt wanted rehab he would need to work on that from the community as we cannot place him from the ED. CSW explained that pts care needs exceed facilities abilities. Pt also does not have an insurance plan that is contracted with facilities. Pt states that he understands though he is frustrated with this information. There is nothing we can do to place the pt. Pt informed CSW that he was told if he got admitted he could go to CIR, CSW explained that this is not how the process works and that he does not meet medical criteria for CIR. Pt is understanding. CSW spoke with [facility] supervisor who confirms information provided to pt was was correct and we cannot hold in ED for placement. CSW visited pt in ED a second time with pts PA, Berle Mull to explain a second time that placement is not possible from the ED. Pt is agreeable to going home. Pt is interested in getting a walker and a bsc. Pt is currently active with [facility] for PT and RN.   CSW spoke with multiple DME companies, none were able to supply equipment that meets pts weight capacity. CSW provided copies of where pt and wife can purchase   CSW updated pts wife of this information, she is understanding of plan. ED staff updated on plan as well and will call for transport for return home.

## 2021-07-25 NOTE — ED Triage Notes (Signed)
Pt here from home by [facility] after a fall; c/o burning and numbness to left knee pain

## 2021-07-25 NOTE — Care Management (Signed)
[facility] ED RN Care Manager, received call from Sharol Roussel [facility] Supervisor concerning assistance with arranging [facility] services.  Reviewed patient record and noted [employer] commercial.  RN CM sent secure chat to Velva Harman to update patient that we will send [facility] referral to several [facility] companies to attempt to secure him [facility] service, although patient received services in the past with [facility] I will still sent referral to [facility], [facility], [facility], [facility], [facility], [facility], via Epic patient was agreeable as per Vernona Rieger RN, TOC supervisor/TOC team  will follow up tomorrow.   Michel Bickers RN, BSN  [facility] ED  RN Care Manager [phone]

## 2021-07-26 ENCOUNTER — Emergency Department (HOSPITAL_COMMUNITY): Payer: No Typology Code available for payment source

## 2021-07-26 ENCOUNTER — Inpatient Hospital Stay (HOSPITAL_COMMUNITY)
Admission: EM | Admit: 2021-07-26 | Discharge: 2021-08-11 | DRG: 291 | Disposition: A | Discharge status: Home Health | Payer: No Typology Code available for payment source | Attending: Internal Medicine | Admitting: Internal Medicine

## 2021-07-26 DIAGNOSIS — R001 Bradycardia, unspecified: Secondary | ICD-10-CM | POA: Diagnosis present

## 2021-07-26 DIAGNOSIS — I517 Cardiomegaly: Secondary | ICD-10-CM

## 2021-07-26 DIAGNOSIS — F4000 Agoraphobia, unspecified: Secondary | ICD-10-CM | POA: Diagnosis present

## 2021-07-26 DIAGNOSIS — R55 Syncope and collapse: Secondary | ICD-10-CM

## 2021-07-26 DIAGNOSIS — J9601 Acute respiratory failure with hypoxia: Secondary | ICD-10-CM | POA: Diagnosis not present

## 2021-07-26 DIAGNOSIS — I5033 Acute on chronic diastolic (congestive) heart failure: Principal | ICD-10-CM | POA: Diagnosis present

## 2021-07-26 DIAGNOSIS — E662 Morbid (severe) obesity with alveolar hypoventilation: Secondary | ICD-10-CM | POA: Diagnosis present

## 2021-07-26 DIAGNOSIS — I872 Venous insufficiency (chronic) (peripheral): Secondary | ICD-10-CM | POA: Diagnosis present

## 2021-07-26 DIAGNOSIS — R7989 Other specified abnormal findings of blood chemistry: Secondary | ICD-10-CM | POA: Diagnosis present

## 2021-07-26 DIAGNOSIS — J449 Chronic obstructive pulmonary disease, unspecified: Secondary | ICD-10-CM | POA: Diagnosis present

## 2021-07-26 DIAGNOSIS — E119 Type 2 diabetes mellitus without complications: Secondary | ICD-10-CM | POA: Diagnosis present

## 2021-07-26 DIAGNOSIS — Z6841 Body Mass Index (BMI) 40.0 and over, adult: Secondary | ICD-10-CM

## 2021-07-26 DIAGNOSIS — Y92009 Unspecified place in unspecified non-institutional (private) residence as the place of occurrence of the external cause: Secondary | ICD-10-CM

## 2021-07-26 DIAGNOSIS — J9621 Acute and chronic respiratory failure with hypoxia: Secondary | ICD-10-CM | POA: Diagnosis present

## 2021-07-26 DIAGNOSIS — F419 Anxiety disorder, unspecified: Secondary | ICD-10-CM

## 2021-07-26 DIAGNOSIS — R0600 Dyspnea, unspecified: Secondary | ICD-10-CM

## 2021-07-26 DIAGNOSIS — Z886 Allergy status to analgesic agent status: Secondary | ICD-10-CM

## 2021-07-26 DIAGNOSIS — I890 Lymphedema, not elsewhere classified: Secondary | ICD-10-CM | POA: Diagnosis present

## 2021-07-26 DIAGNOSIS — F32A Depression, unspecified: Secondary | ICD-10-CM | POA: Diagnosis present

## 2021-07-26 DIAGNOSIS — M549 Dorsalgia, unspecified: Secondary | ICD-10-CM | POA: Diagnosis present

## 2021-07-26 DIAGNOSIS — J9602 Acute respiratory failure with hypercapnia: Secondary | ICD-10-CM | POA: Diagnosis present

## 2021-07-26 DIAGNOSIS — L304 Erythema intertrigo: Secondary | ICD-10-CM | POA: Diagnosis present

## 2021-07-26 DIAGNOSIS — W1839XA Other fall on same level, initial encounter: Secondary | ICD-10-CM | POA: Diagnosis present

## 2021-07-26 DIAGNOSIS — M25562 Pain in left knee: Secondary | ICD-10-CM | POA: Diagnosis present

## 2021-07-26 DIAGNOSIS — Z79899 Other long term (current) drug therapy: Secondary | ICD-10-CM

## 2021-07-26 DIAGNOSIS — Z87891 Personal history of nicotine dependence: Secondary | ICD-10-CM

## 2021-07-26 DIAGNOSIS — J9622 Acute and chronic respiratory failure with hypercapnia: Secondary | ICD-10-CM | POA: Diagnosis present

## 2021-07-26 DIAGNOSIS — M1990 Unspecified osteoarthritis, unspecified site: Secondary | ICD-10-CM | POA: Diagnosis present

## 2021-07-26 DIAGNOSIS — J9611 Chronic respiratory failure with hypoxia: Secondary | ICD-10-CM | POA: Diagnosis present

## 2021-07-26 DIAGNOSIS — G8929 Other chronic pain: Secondary | ICD-10-CM | POA: Diagnosis present

## 2021-07-26 DIAGNOSIS — R5381 Other malaise: Secondary | ICD-10-CM | POA: Diagnosis present

## 2021-07-26 DIAGNOSIS — Z20822 Contact with and (suspected) exposure to covid-19: Secondary | ICD-10-CM | POA: Diagnosis present

## 2021-07-26 DIAGNOSIS — Y9301 Activity, walking, marching and hiking: Secondary | ICD-10-CM | POA: Diagnosis present

## 2021-07-26 LAB — URINALYSIS, ROUTINE W REFLEX MICROSCOPIC
Bilirubin Urine: NEGATIVE
Glucose, UA: NEGATIVE mg/dL
Hgb urine dipstick: NEGATIVE
Ketones, ur: NEGATIVE mg/dL
Leukocytes,Ua: NEGATIVE
Nitrite: NEGATIVE
Protein, ur: NEGATIVE mg/dL
Specific Gravity, Urine: 1.025 (ref 1.005–1.030)
pH: 6 (ref 5.0–8.0)

## 2021-07-26 LAB — COMPREHENSIVE METABOLIC PANEL
ALT: 14 U/L (ref 0–44)
AST: 20 U/L (ref 15–41)
Albumin: 2.9 g/dL — ABNORMAL LOW (ref 3.5–5.0)
Alkaline Phosphatase: 96 U/L (ref 38–126)
Anion gap: 8 (ref 5–15)
BUN: 16 mg/dL (ref 6–20)
CO2: 28 mmol/L (ref 22–32)
Calcium: 8.1 mg/dL — ABNORMAL LOW (ref 8.9–10.3)
Chloride: 101 mmol/L (ref 98–111)
Creatinine, Ser: 1.19 mg/dL (ref 0.61–1.24)
GFR, Estimated: >60 mL/min (ref >60)
Glucose, Bld: 105 mg/dL — ABNORMAL HIGH (ref 70–99)
Potassium: 4.2 mmol/L (ref 3.5–5.1)
Sodium: 137 mmol/L (ref 135–145)
Total Bilirubin: 1.1 mg/dL (ref 0.3–1.2)
Total Protein: 6.8 g/dL (ref 6.5–8.1)

## 2021-07-26 LAB — I-STAT ARTERIAL BLOOD GAS, ED
Acid-Base Excess: 6 mmol/L — ABNORMAL HIGH (ref 0.0–2.0)
Bicarbonate: 33.2 mmol/L — ABNORMAL HIGH (ref 20.0–28.0)
Calcium, Ion: 1.14 mmol/L — ABNORMAL LOW (ref 1.15–1.40)
HCT: 45 % (ref 39.0–52.0)
Hemoglobin: 15.3 g/dL (ref 13.0–17.0)
O2 Saturation: 97 %
Patient temperature: 98
Potassium: 4 mmol/L (ref 3.5–5.1)
Sodium: 141 mmol/L (ref 135–145)
TCO2: 35 mmol/L — ABNORMAL HIGH (ref 22–32)
pCO2 arterial: 54.7 mmHg — ABNORMAL HIGH (ref 32.0–48.0)
pH, Arterial: 7.39 (ref 7.350–7.450)
pO2, Arterial: 92 mmHg (ref 83.0–108.0)

## 2021-07-26 LAB — CBC WITH DIFFERENTIAL/PLATELET
Abs Immature Granulocytes: 0.01 10*3/uL (ref 0.00–0.07)
Basophils Absolute: 0 10*3/uL (ref 0.0–0.1)
Basophils Relative: 1 %
Eosinophils Absolute: 0.1 10*3/uL (ref 0.0–0.5)
Eosinophils Relative: 1 %
HCT: 40.7 % (ref 39.0–52.0)
Hemoglobin: 12.2 g/dL — ABNORMAL LOW (ref 13.0–17.0)
Immature Granulocytes: 0 %
Lymphocytes Relative: 15 %
Lymphs Abs: 1 10*3/uL (ref 0.7–4.0)
MCH: 26.9 pg (ref 26.0–34.0)
MCHC: 30 g/dL (ref 30.0–36.0)
MCV: 89.8 fL (ref 80.0–100.0)
Monocytes Absolute: 0.5 10*3/uL (ref 0.1–1.0)
Monocytes Relative: 8 %
Neutro Abs: 4.8 10*3/uL (ref 1.7–7.7)
Neutrophils Relative %: 75 %
Platelets: 203 10*3/uL (ref 150–400)
RBC: 4.53 MIL/uL (ref 4.22–5.81)
RDW: 16.2 % — ABNORMAL HIGH (ref 11.5–15.5)
WBC: 6.4 10*3/uL (ref 4.0–10.5)
nRBC: 0 % (ref 0.0–0.2)

## 2021-07-26 LAB — RESP PANEL BY RT-PCR (FLU A&B, COVID) ARPGX2
Influenza A by PCR: NEGATIVE
Influenza B by PCR: NEGATIVE
SARS Coronavirus 2 by RT PCR: NEGATIVE

## 2021-07-26 LAB — PROTIME-INR
INR: 1.3 — ABNORMAL HIGH (ref 0.8–1.2)
Prothrombin Time: 15.7 seconds — ABNORMAL HIGH (ref 11.4–15.2)

## 2021-07-26 LAB — BRAIN NATRIURETIC PEPTIDE: B Natriuretic Peptide: 229.8 pg/mL — ABNORMAL HIGH (ref 0.0–100.0)

## 2021-07-26 LAB — HIV ANTIBODY (ROUTINE TESTING W REFLEX): HIV Screen 4th Generation wRfx: NONREACTIVE

## 2021-07-26 LAB — TSH: TSH: 2.622 u[IU]/mL (ref 0.350–4.500)

## 2021-07-26 LAB — D-DIMER, QUANTITATIVE: D-Dimer, Quant: 1.41 ug/mL-FEU — ABNORMAL HIGH (ref 0.00–0.50)

## 2021-07-26 LAB — APTT: aPTT: 32 seconds (ref 24–36)

## 2021-07-26 MED ORDER — ONDANSETRON HCL 4 MG/2ML IJ SOLN: 4.0000 mg | Freq: Four times a day (QID) | INTRAMUSCULAR | Status: DC | PRN

## 2021-07-26 MED ORDER — SODIUM CHLORIDE 0.9 % IV BOLUS
500.0000 mL | Freq: Once | INTRAVENOUS | Status: AC
  Administered 2021-07-26: 500 mL via INTRAVENOUS

## 2021-07-26 MED ORDER — SODIUM CHLORIDE 0.9% FLUSH
3.0000 mL | Freq: Two times a day (BID) | INTRAVENOUS | Status: DC
  Administered 2021-07-26 – 2021-08-11 (×32): 3 mL via INTRAVENOUS

## 2021-07-26 MED ORDER — ACETAMINOPHEN 325 MG PO TABS: 650.0000 mg | ORAL_TABLET | Freq: Four times a day (QID) | ORAL | Status: DC | PRN

## 2021-07-26 MED ORDER — MIRTAZAPINE 15 MG PO TABS
15.0000 mg | ORAL_TABLET | Freq: Every day | ORAL | Status: DC
  Administered 2021-07-26 – 2021-08-10 (×16): 15 mg via ORAL
  Filled 2021-07-26 (×17): qty 1

## 2021-07-26 MED ORDER — ALBUTEROL SULFATE (2.5 MG/3ML) 0.083% IN NEBU
2.5000 mg | INHALATION_SOLUTION | Freq: Four times a day (QID) | RESPIRATORY_TRACT | Status: DC | PRN
  Filled 2021-07-26: qty 3

## 2021-07-26 MED ORDER — ACETAMINOPHEN 650 MG RE SUPP: 650.0000 mg | Freq: Four times a day (QID) | RECTAL | Status: DC | PRN

## 2021-07-26 MED ORDER — ONDANSETRON HCL 4 MG PO TABS: 4.0000 mg | ORAL_TABLET | Freq: Four times a day (QID) | ORAL | Status: DC | PRN

## 2021-07-26 MED ORDER — HEPARIN (PORCINE) 25000 UT/250ML-% IV SOLN
2700.0000 [IU]/h | INTRAVENOUS | Status: DC
  Administered 2021-07-26: 2500 [IU]/h via INTRAVENOUS
  Administered 2021-07-27: 2700 [IU]/h via INTRAVENOUS
  Filled 2021-07-26 (×2): qty 250

## 2021-07-26 MED ORDER — LORATADINE 10 MG PO TABS: 10.0000 mg | ORAL_TABLET | Freq: Every day | ORAL | Status: DC | PRN

## 2021-07-26 MED ORDER — SERTRALINE HCL 50 MG PO TABS
150.0000 mg | ORAL_TABLET | Freq: Every day | ORAL | Status: DC
  Administered 2021-07-26 – 2021-08-11 (×17): 150 mg via ORAL
  Filled 2021-07-26 (×18): qty 1

## 2021-07-26 MED ORDER — FUROSEMIDE 10 MG/ML IJ SOLN
40.0000 mg | Freq: Two times a day (BID) | INTRAMUSCULAR | Status: DC
  Administered 2021-07-26 – 2021-07-30 (×9): 40 mg via INTRAVENOUS
  Filled 2021-07-26 (×9): qty 4

## 2021-07-26 MED ORDER — IOHEXOL 350 MG/ML SOLN
100.0000 mL | Freq: Once | INTRAVENOUS | Status: AC | PRN
  Administered 2021-07-26: 100 mL via INTRAVENOUS

## 2021-07-26 MED ORDER — HEPARIN BOLUS VIA INFUSION
7500.0000 [IU] | Freq: Once | INTRAVENOUS | Status: AC
  Administered 2021-07-26: 7500 [IU] via INTRAVENOUS
  Filled 2021-07-26: qty 7500

## 2021-07-26 NOTE — ED Triage Notes (Signed)
BIB [facility] after pt was taken home post d/c from [facility] several hours ago r/t to syncopal episode. pt's last syncopal episode witnessed by family was yesterday around 0700. Pt also c/o of lymphedema in legs, weakness.

## 2021-07-26 NOTE — ED Notes (Signed)
Pt speaking to psychologist on phone.

## 2021-07-26 NOTE — Progress Notes (Signed)
ANTICOAGULATION CONSULT NOTE - Initial Consult  Pharmacy Consult for Heparin Indication: pulmonary embolus  Allergies  Allergen Reactions   Ibuprofen Swelling   Naproxen Swelling   Neosporin Plus Max St Rash   Nsaids Rash    Patient Measurements:   Heparin Dosing Weight: 160.4 kgs  Vital Signs: Temp: 98 F (36.7 C) (08/10 1348) Temp Source: Oral (08/10 1348) BP: 133/66 (08/10 1615) Pulse Rate: 57 (08/10 1615)  Labs: Recent Labs    07/26/21 1414 07/26/21 1549  HGB 12.2* 15.3  HCT 40.7 45.0  PLT 203  --   CREATININE 1.19  --     Estimated Creatinine Clearance: 169.8 mL/min (by C-G formula based on SCr of 1.19 mg/dL).   Medical History: Past Medical History:  Diagnosis Date   Arthritis    Diabetes mellitus without complication (HCC)    Obesities, morbid (HCC)     Assessment: 55 YO obese male presenting after a fall at home when going to the bathroom. O2 sat was 87 on admission and was improved to 100 w/ 2L [place] oxygen. Labs significant for d-dimer 1.41 and BNP 229.8. Has SOB and CT notes from [facility] state PE suspected with a high probability. H&H 15.3, 45, CBC WNL.  Goal of Therapy:  Heparin level 0.3-0.7 units/ml Monitor platelets by anticoagulation protocol: Yes   Plan:  Give 7,500 units bolus x 1 Start heparin infusion at 2,500 units/hr Check anti-Xa level in 6 hours and daily while on heparin Continue to monitor H&H and platelets Monitor for signs and symptoms of bleeding Subsequent dosing per HL  Follow-up anticoagulation plan   March Rummage, PharmD PGY1 Pharmacy Resident  Please check AMION for all [facility] pharmacy phone numbers After 10:00 PM call main pharmacy [phone]

## 2021-07-26 NOTE — ED Provider Notes (Signed)
[facility] EMERGENCY DEPARTMENT Provider Note   CSN: [id number] Arrival date & time: 07/26/21  1223     History Chief Complaint  Patient presents with   Loss of Consciousness    Hunter King is a 55 y.o. male.  HPI 55 year old male presents with syncope.  Yesterday morning when he got up to go to the bathroom after first waking up he walked to the bathroom and felt progressively lightheaded.  Got to the point where he could not stand and briefly passed out and fell.  Had to call the rescue squad to pick him up and they brought him to [facility] where he was evaluated for a knee injury.  He has chronic dyspnea on exertion for about a year.  This is not getting particularly worse.  There is no headache, chest pain, vomiting.  He might be a little bit dehydrated now because he states he was in [facility] for about 24 hours until they could discharge him this morning.  When he got home by EMS, his family was there and sent him to the ER because they are concerned that he did not get an evaluation.  He has no new complaints since the original presentation.  No new falls or dizziness/syncope. Chronic lymphedema, which seems to be improving since wrapping his legs a few weeks ago.  Past Medical History:  Diagnosis Date   Arthritis    Diabetes mellitus without complication (HCC)    Obesities, morbid (HCC)     There are no problems to display for this patient.   Past Surgical History:  Procedure Laterality Date   FINGER SURGERY Right    HERNIA REPAIR     HYDROCELE EXCISION / REPAIR     KNEE SURGERY Right        Family History  Problem Relation Age of Onset   Fibromyalgia Mother     Social History   Tobacco Use   Smoking status: Former    Types: Cigarettes   Smokeless tobacco: Never  Substance Use Topics   Alcohol use: Yes    Comment: socially on weekends   Drug use: Not Currently    Home Medications Prior to Admission medications   Medication  Sig Start Date End Date Taking? Authorizing Provider  acetaminophen (TYLENOL) 325 MG tablet Take 650 mg by mouth every 6 (six) hours as needed.    [provider]  clobetasol ointment (TEMOVATE) 0.05 % APPLY EXTERNALLY TO THE AFFECTED AREA TWICE DAILY Patient not taking: No sig reported 11/20/19   Vivi Barrack, DPM  furosemide (LASIX) 40 MG tablet Take 40 mg by mouth daily. 12/23/18   [provider]  loratadine (CLARITIN) 10 MG tablet Take 1 tablet (10 mg total) by mouth daily. 07/25/21 08/24/21  Carroll Sage, PA-C  mirtazapine (REMERON) 15 MG tablet Take 15 mg by mouth at bedtime as needed. 07/10/21   [provider]  mupirocin ointment (BACTROBAN) 2 % Apply 1 application topically 2 (two) times daily. Patient not taking: No sig reported 05/19/20   Vivi Barrack, DPM  potassium chloride (K-DUR) 10 MEQ tablet Take 10 mEq by mouth daily. 11/27/18   [provider]  sertraline (ZOLOFT) 100 MG tablet Take 150 mg by mouth daily. 06/28/21   [provider]  urea (CARMOL) 40 % CREA Apply 1 application topically daily. To callus areas on bottom of feet Patient not taking: No sig reported 11/20/19   Vivi Barrack, DPM  Allergies    Ibuprofen, Naproxen, and Nsaids  Review of Systems   Review of Systems  Constitutional:  Negative for fever.  Respiratory:  Positive for shortness of breath.   Cardiovascular:  Negative for chest pain.  Gastrointestinal:  Negative for abdominal pain and vomiting.  Neurological:  Positive for syncope and light-headedness. Negative for headaches.  All other systems reviewed and are negative.  Physical Exam Updated Vital Signs BP (!) 131/56   Pulse 62   Temp 98 F (36.7 C) (Oral)   Resp 19   SpO2 94%   Physical Exam Vitals and nursing note reviewed.  Constitutional:      Appearance: He is well-developed. He is obese.  HENT:     Head: Normocephalic and atraumatic.     Right Ear: External ear normal.      Left Ear: External ear normal.     Nose: Nose normal.  Eyes:     General:        Right eye: No discharge.        Left eye: No discharge.  Cardiovascular:     Rate and Rhythm: Normal rate and regular rhythm.     Heart sounds: Normal heart sounds.  Pulmonary:     Effort: Pulmonary effort is normal.     Breath sounds: Normal breath sounds. No wheezing.  Abdominal:     General: There is no distension.     Palpations: Abdomen is soft.     Tenderness: There is no abdominal tenderness.  Musculoskeletal:     Cervical back: Neck supple.  Skin:    General: Skin is warm and dry.  Neurological:     Mental Status: He is alert.  Psychiatric:        Mood and Affect: Mood is not anxious.    ED Results / Procedures / Treatments   Labs (all labs ordered are listed, but only abnormal results are displayed) Labs Reviewed  COMPREHENSIVE METABOLIC PANEL - Abnormal; Notable for the following components:      Result Value   Glucose, Bld 105 (*)    Calcium 8.1 (*)    Albumin 2.9 (*)    All other components within normal limits  CBC WITH DIFFERENTIAL/PLATELET - Abnormal; Notable for the following components:   Hemoglobin 12.2 (*)    RDW 16.2 (*)    All other components within normal limits  BRAIN NATRIURETIC PEPTIDE - Abnormal; Notable for the following components:   B Natriuretic Peptide 229.8 (*)    All other components within normal limits  URINALYSIS, ROUTINE W REFLEX MICROSCOPIC - Abnormal; Notable for the following components:   Color, Urine AMBER (*)    All other components within normal limits  RESP PANEL BY RT-PCR (FLU A&B, COVID) [id number]  D-DIMER, QUANTITATIVE  I-STAT ARTERIAL BLOOD GAS, ED    EKG EKG Interpretation  Date/Time:  Wednesday July 26 2021 13:26:33 EDT Ventricular Rate:  73 PR Interval:  183 QRS Duration: 106 QT Interval:  408 QTC Calculation: 450 R Axis:   -56 Text Interpretation: Sinus rhythm Incomplete RBBB and LAFB Low voltage, precordial leads  Abnormal R-wave progression, early transition No old tracing to compare Confirmed by Pricilla Loveless [phone]) on 07/26/2021 2:55:04 PM  Radiology DG Chest 1 View  Result Date: 07/26/2021 CLINICAL DATA:  Dyspnea.  Leg swelling. EXAM: CHEST  1 VIEW COMPARISON:  None. FINDINGS: Cardiac enlargement. No pleural effusion or edema. No airspace opacities identified. The visualized osseous structures are unremarkable. IMPRESSION: 1. Cardiac enlargement. 2. No acute  findings. Electronically Signed   By: Signa Kell M.D.   On: 07/26/2021 14:52   DG Knee Complete 4 Views Left  Result Date: 07/25/2021 CLINICAL DATA:  Fall EXAM: LEFT KNEE - COMPLETE 4+ VIEW COMPARISON:  Knee radiographs 05/30/2006 FINDINGS: Study quality is degraded due to suboptimal patient positioning particularly on the lateral projection. There is no acute fracture or dislocation. Alignment is normal on the AP images. There is moderate medial and mild lateral tibiofemoral joint space narrowing with associated subchondral sclerosis and osteophytosis. A suprasellar effusion cannot be excluded due to positioning. IMPRESSION: No fracture or dislocation, within the confines of suboptimal patient positioning. Electronically Signed   By: Lesia Hausen MD   On: 07/25/2021 10:46    Procedures Procedures   Medications Ordered in ED Medications  sodium chloride 0.9 % bolus 500 mL (500 mLs Intravenous New Bag/Given 07/26/21 1402)    ED Course  I have reviewed the triage vital signs and the nursing notes.  Pertinent labs & imaging results that were available during my care of the patient were reviewed by me and considered in my medical decision making (see chart for details).    MDM Rules/Calculators/A&P                           Initial work-up is pretty unrevealing.  However when I went to reassess the patient, it was noted that he is hypoxic into the mid to low 80s, lowest was 83%.  He is feeling a little short of breath.  Part and he wonders  if this is related to his obesity rather than an acute lung abnormality.  His chest x-ray is unremarkable.  BNP is in a nonspecific range.  I will add on a D-dimer but overall this seems like it is probably more from a pickwickian problem.  Either way he will now need admission due to the hypoxia.  I discussed case with Dr. Katrinka Blazing, who asks for CTA rather than Ddimer to eval for PE given new hypoxia. This has been ordered. Otherwise currently stable for admission. Final Clinical Impression(s) / ED Diagnoses Final diagnoses:  Acute respiratory failure with hypoxia [facility])    Rx / DC Orders ED Discharge Orders     None        Pricilla Loveless, MD 07/26/21 [phone]

## 2021-07-26 NOTE — ED Notes (Addendum)
I introduced myself to pt. Pt AxO x4. GCS 15. C/O some chronic pain in RLE. Pt's extremities are extremely edematous with R being worse than L. Both legs were rewrapped with keflex and ACE wrap. The R leg is slightly bleeding underneath so pads placed beneath that. Pt has urinal at bedside. On 3 L [place]. Pt denies further needs.

## 2021-07-26 NOTE — H&P (Addendum)
History and Physical    Hunter King:[id number] DOB: December 11, 1966 DOA: 07/26/2021  Referring MD/NP/PA: Pricilla Loveless, MD PCP: Avis Epley, PA-C  Patient coming from: home via EMS  Chief Complaint: Fall  I have personally briefly reviewed patient's old medical records in [facility]   HPI: Hunter King is a 55 y.o. male with medical history significant of anxiety, depression, lymphedema, and morbid obesity presents after having a fall at home yesterday.  History is obtained from the patient as well as sister who is present at bedside.  Patient recalls getting up to go to the bathroom, his breathing was labored , and as he was stepping into the bathroom he felt his legs getting heavy prior to passing out.  After patient fell to the floor he came to immediately but was unable to get up and complained of left knee pain.  Thereafter he was not able to get himself up and EMS ultimately had to be called.  He noted that he had to be dragged out of the house in order to be picked up to get into the ambulance.  Patient seen at [facility], but they only checked x-rays of his knee and discharged him home.  On family was upset as no blood work had been done and further investigation into why he passed out.  His sister gives additional history noticed that the patient has not been out of his house in over 3 years.  He is on 3 medications including Zoloft for anxiety/ depression/ OCD/ agoraphobia, mirtazapine for sleep/mood, and furosemide for chronic lymphedema of his legs.  He has lymphedema with chronic sores that weep, and currently bleeding due to having to be dragged out of his house by EMS.  His sister notes that he has  home health that comes out and change the dressings and wraps his leg.  At baseline patient is sedentary, but reportedly is able to get up and walk on his own.  ED Course: Upon admission to the emergency department patient was seen to be afebrile with pulse  57-74 blood pressures maintained, and O2 saturations 87 with improvement to 100% on 2 L of nasal cannula oxygen.  Labs significant for D-dimer 1.41 and BNP 229.8.  Chest x-ray noted cardiac enlargement.  Urinalysis negative for any acute abnormalities.  Review of Systems  Constitutional:  Positive for malaise/fatigue. Negative for fever.  HENT:  Negative for congestion.   Eyes:  Negative for photophobia and pain.  Respiratory:  Positive for shortness of breath.   Cardiovascular:  Positive for leg swelling. Negative for chest pain.  Gastrointestinal:  Negative for abdominal pain, nausea and vomiting.  Genitourinary:  Negative for dysuria.  Musculoskeletal:  Positive for falls and joint pain.  Skin:        Positive for chronic wounds of the bilateral leg  Neurological:  Positive for loss of consciousness. Negative for focal weakness.       Positive for excessive daytime sleepiness  Psychiatric/Behavioral:  Negative for substance abuse. The patient is nervous/anxious.   All other systems reviewed and are negative.  Past Medical History:  Diagnosis Date   Arthritis    Diabetes mellitus without complication (HCC)    Obesities, morbid (HCC)     Past Surgical History:  Procedure Laterality Date   FINGER SURGERY Right    HERNIA REPAIR     HYDROCELE EXCISION / REPAIR     KNEE SURGERY Right      reports that he  has quit smoking. His smoking use included cigarettes. He has never used smokeless tobacco. He reports current alcohol use. He reports previous drug use.  Allergies  Allergen Reactions   Ibuprofen Swelling   Naproxen Swelling   Nsaids Rash    Family History  Problem Relation Age of Onset   Fibromyalgia Mother     Prior to Admission medications   Medication Sig Start Date End Date Taking? Authorizing Provider  acetaminophen (TYLENOL) 325 MG tablet Take 650 mg by mouth every 6 (six) hours as needed.    [provider]  clobetasol ointment (TEMOVATE) 0.05 % APPLY  EXTERNALLY TO THE AFFECTED AREA TWICE DAILY Patient not taking: No sig reported 11/20/19   Vivi Barrack, DPM  furosemide (LASIX) 40 MG tablet Take 40 mg by mouth daily. 12/23/18   [provider]  loratadine (CLARITIN) 10 MG tablet Take 1 tablet (10 mg total) by mouth daily. 07/25/21 08/24/21  Carroll Sage, PA-C  mirtazapine (REMERON) 15 MG tablet Take 15 mg by mouth at bedtime as needed. 07/10/21   [provider]  mupirocin ointment (BACTROBAN) 2 % Apply 1 application topically 2 (two) times daily. Patient not taking: No sig reported 05/19/20   Vivi Barrack, DPM  potassium chloride (K-DUR) 10 MEQ tablet Take 10 mEq by mouth daily. 11/27/18   [provider]  sertraline (ZOLOFT) 100 MG tablet Take 150 mg by mouth daily. 06/28/21   [provider]  urea (CARMOL) 40 % CREA Apply 1 application topically daily. To callus areas on bottom of feet Patient not taking: No sig reported 11/20/19   Vivi Barrack, DPM    Physical Exam:  Constitutional: Morbidly obese male who appears to be no acute distress at this time Vitals:   07/26/21 1224 07/26/21 1345 07/26/21 1348 07/26/21 1400  BP:  (!) 110/94  (!) 131/56  Pulse:  74  62  Resp:  (!) 22  19  Temp:   98 F (36.7 C)   TempSrc:   Oral   SpO2: 95% 92%  94%   Eyes: PERRL, lids and conjunctivae normal ENMT: Mucous membranes are moist. Posterior pharynx clear of any exudate or lesions.     Neck: normal, supple, no masses, no thyromegaly Respiratory: Significantly decreased overall aeration with no significant wheezes appreciated. Cardiovascular: Bradycardic with distant heart sounds.  Significant bilateral lower extremity edema. No carotid bruits.  Abdomen: Large pannus with no tenderness to palpation appreciated.  Bowel sounds present. Musculoskeletal: no  cyanosis. No joint deformity upper and lower extremities.  Bilateral legs are wrapped. Skin: Patient has lymphedema with open wounds bleeding  and some weeping serosanguineous fluid.  Right leg pictured below.     Neurologic: CN 2-12 grossly intact. Sensation intact, DTR normal. Strength 5/5 in all 4.  Psychiatric: Normal judgment and insight. Alert and oriented x 3. Normal mood.     Labs on Admission: I have personally reviewed following labs and imaging studies  CBC: Recent Labs  Lab 07/26/21 1414  WBC 6.4  NEUTROABS 4.8  HGB 12.2*  HCT 40.7  MCV 89.8  PLT 203   Basic Metabolic Panel: Recent Labs  Lab 07/26/21 1414  NA 137  K 4.2  CL 101  CO2 28  GLUCOSE 105*  BUN 16  CREATININE 1.19  CALCIUM 8.1*   GFR: Estimated Creatinine Clearance: 169.8 mL/min (by C-G formula based on SCr of 1.19 mg/dL). Liver Function Tests: Recent Labs  Lab 07/26/21 1414  AST 20  ALT  14  ALKPHOS 96  BILITOT 1.1  PROT 6.8  ALBUMIN 2.9*   No results for input(s): LIPASE, AMYLASE in the last 168 hours. No results for input(s): AMMONIA in the last 168 hours. Coagulation Profile: No results for input(s): INR, PROTIME in the last 168 hours. Cardiac Enzymes: No results for input(s): CKTOTAL, CKMB, CKMBINDEX, TROPONINI in the last 168 hours. BNP (last 3 results) No results for input(s): PROBNP in the last 8760 hours. HbA1C: No results for input(s): HGBA1C in the last 72 hours. CBG: No results for input(s): GLUCAP in the last 168 hours. Lipid Profile: No results for input(s): CHOL, HDL, LDLCALC, TRIG, CHOLHDL, LDLDIRECT in the last 72 hours. Thyroid Function Tests: No results for input(s): TSH, T4TOTAL, FREET4, T3FREE, THYROIDAB in the last 72 hours. Anemia Panel: No results for input(s): VITAMINB12, FOLATE, FERRITIN, TIBC, IRON, RETICCTPCT in the last 72 hours. Urine analysis:    Component Value Date/Time   COLORURINE AMBER (A) 07/26/2021 1350   APPEARANCEUR CLEAR 07/26/2021 1350   LABSPEC 1.025 07/26/2021 1350   PHURINE 6.0 07/26/2021 1350   GLUCOSEU NEGATIVE 07/26/2021 1350   HGBUR NEGATIVE 07/26/2021 1350    BILIRUBINUR NEGATIVE 07/26/2021 1350   KETONESUR NEGATIVE 07/26/2021 1350   PROTEINUR NEGATIVE 07/26/2021 1350   UROBILINOGEN 2.0 (H) 10/19/2018 1609   NITRITE NEGATIVE 07/26/2021 1350   LEUKOCYTESUR NEGATIVE 07/26/2021 1350   Sepsis Labs: No results found for this or any previous visit (from the past 240 hour(s)).   Radiological Exams on Admission: DG Chest 1 View  Result Date: 07/26/2021 CLINICAL DATA:  Dyspnea.  Leg swelling. EXAM: CHEST  1 VIEW COMPARISON:  None. FINDINGS: Cardiac enlargement. No pleural effusion or edema. No airspace opacities identified. The visualized osseous structures are unremarkable. IMPRESSION: 1. Cardiac enlargement. 2. No acute findings. Electronically Signed   By: Signa Kellaylor  Stroud M.D.   On: 07/26/2021 14:52   DG Knee Complete 4 Views Left  Result Date: 07/25/2021 CLINICAL DATA:  Fall EXAM: LEFT KNEE - COMPLETE 4+ VIEW COMPARISON:  Knee radiographs 05/30/2006 FINDINGS: Study quality is degraded due to suboptimal patient positioning particularly on the lateral projection. There is no acute fracture or dislocation. Alignment is normal on the AP images. There is moderate medial and mild lateral tibiofemoral joint space narrowing with associated subchondral sclerosis and osteophytosis. A suprasellar effusion cannot be excluded due to positioning. IMPRESSION: No fracture or dislocation, within the confines of suboptimal patient positioning. Electronically Signed   By: Lesia HausenPeter  Noone MD   On: 07/25/2021 10:46    EKG: Independently reviewed.  Sinus rhythm at 73 bpm with incomplete RBBB and LAFB  Assessment/Plan Acute respiratory failure with hypercapnia and hypoxia suspect obesity hypoventilation syndrome: Patient reports having generalized fatigue shortness of breath.  Noted to be hypoxic down to 87% on room air for which patient was placed on 2 L of nasal cannula oxygen with O2 saturations maintained.  ABG revealed pH 7.39 with PCO2 54.7, -Admit to a medical telemetry  bed -Continue 2 L nasal cannula oxygen to maintain O2 saturations greater than 92% -PCCM consulted and Dr. Vassie LollAlva to see in a.m.  Syncope and collapse: Patient presented after collapsing after getting up to use the bathroom.  He complained of difficulty breathing prior to his legs getting heavy in him losing consciousness briefly.  Suspect this could be possibly vasovagal in nature.  However, patient did have elevated D-dimer with concern for possible PE/DVT vs arrhythmia . -Follow-up CT angiogram of the chest -Follow-up telemetry overnight  Elevated D-dimer:  Acute.  D-dimer elevated at 1.41.  Question possibility of underlying DVT or PE given patient is sedentary at baseline. -Check Doppler ultrasound of the lower extremities -Heparin per pharmacy for treatment of possible VTE    Cardiomegaly suspected heart failure exacerbation: Acute.  Noted to have enlarged cardiac silhouette noted on chest x-ray although no reports of fluid.  Patient's BNP was elevated at 229.8. -Strict I&Os and daily weight -Check echocardiogram -Lasix 40 mg IV twice daily -Formally consult cardiology,  if needed after echocardiogram  Bradycardia: On telemetry patient had not have heart rates in the 50s, but blood pressures otherwise maintained.   -Follow-up telemetry overnight  Debility secondary to morbid obesity: BMI 83.46 kg/m -PT/OT to eval and treat -Dietitian consult  -Transitions of care consulted  Lymphedema: Patient has chronic lymphedema with weeping wounds on physical exam.  Right leg reported worse than the left.  No clear signs of infection appreciated. -Wound care consult  Anxiety and depression -Continue Zoloft and mirtazapine    DVT prophylaxis: Heparin Code Status: Full Family Communication: Sister updated at bedside Disposition Plan: To be determined Consults called:  None Admission status: Observation  Clydie Braun MD [facility]   If 7PM-7AM, please contact  night-coverage   07/26/2021, 3:47 PM

## 2021-07-26 NOTE — ED Notes (Signed)
Pt's daughter brought pt food. He's talking on the phone with family. Resting in bed.

## 2021-07-26 NOTE — ED Notes (Signed)
RN notified of pts o2 levels

## 2021-07-26 NOTE — ED Notes (Signed)
We put a male purewick on pt and half went into the bag and half didn't. Pt also had to have a BM so placed on bed pan. Peri-care completed on pt. Bed sheets changed.

## 2021-07-27 ENCOUNTER — Observation Stay (HOSPITAL_COMMUNITY): Payer: No Typology Code available for payment source

## 2021-07-27 ENCOUNTER — Other Ambulatory Visit: Payer: Self-pay

## 2021-07-27 ENCOUNTER — Telehealth: Payer: Self-pay | Admitting: Pulmonary Disease

## 2021-07-27 DIAGNOSIS — R0602 Shortness of breath: Secondary | ICD-10-CM | POA: Diagnosis not present

## 2021-07-27 DIAGNOSIS — J9601 Acute respiratory failure with hypoxia: Secondary | ICD-10-CM | POA: Diagnosis not present

## 2021-07-27 DIAGNOSIS — I517 Cardiomegaly: Secondary | ICD-10-CM | POA: Diagnosis present

## 2021-07-27 DIAGNOSIS — E662 Morbid (severe) obesity with alveolar hypoventilation: Secondary | ICD-10-CM

## 2021-07-27 DIAGNOSIS — F419 Anxiety disorder, unspecified: Secondary | ICD-10-CM | POA: Diagnosis present

## 2021-07-27 DIAGNOSIS — R001 Bradycardia, unspecified: Secondary | ICD-10-CM | POA: Diagnosis present

## 2021-07-27 DIAGNOSIS — I872 Venous insufficiency (chronic) (peripheral): Secondary | ICD-10-CM | POA: Diagnosis present

## 2021-07-27 DIAGNOSIS — R55 Syncope and collapse: Secondary | ICD-10-CM | POA: Diagnosis present

## 2021-07-27 DIAGNOSIS — I890 Lymphedema, not elsewhere classified: Secondary | ICD-10-CM | POA: Diagnosis present

## 2021-07-27 DIAGNOSIS — Z20822 Contact with and (suspected) exposure to covid-19: Secondary | ICD-10-CM | POA: Diagnosis present

## 2021-07-27 DIAGNOSIS — F32A Depression, unspecified: Secondary | ICD-10-CM | POA: Diagnosis present

## 2021-07-27 DIAGNOSIS — M549 Dorsalgia, unspecified: Secondary | ICD-10-CM | POA: Diagnosis present

## 2021-07-27 DIAGNOSIS — J9602 Acute respiratory failure with hypercapnia: Secondary | ICD-10-CM

## 2021-07-27 DIAGNOSIS — J9611 Chronic respiratory failure with hypoxia: Secondary | ICD-10-CM | POA: Diagnosis present

## 2021-07-27 DIAGNOSIS — F4000 Agoraphobia, unspecified: Secondary | ICD-10-CM | POA: Diagnosis present

## 2021-07-27 DIAGNOSIS — L304 Erythema intertrigo: Secondary | ICD-10-CM | POA: Diagnosis present

## 2021-07-27 DIAGNOSIS — Z79899 Other long term (current) drug therapy: Secondary | ICD-10-CM | POA: Diagnosis not present

## 2021-07-27 DIAGNOSIS — R7989 Other specified abnormal findings of blood chemistry: Secondary | ICD-10-CM

## 2021-07-27 DIAGNOSIS — W1839XA Other fall on same level, initial encounter: Secondary | ICD-10-CM | POA: Diagnosis present

## 2021-07-27 DIAGNOSIS — E119 Type 2 diabetes mellitus without complications: Secondary | ICD-10-CM | POA: Diagnosis present

## 2021-07-27 DIAGNOSIS — M1990 Unspecified osteoarthritis, unspecified site: Secondary | ICD-10-CM | POA: Diagnosis present

## 2021-07-27 DIAGNOSIS — M25562 Pain in left knee: Secondary | ICD-10-CM | POA: Diagnosis present

## 2021-07-27 DIAGNOSIS — Y9301 Activity, walking, marching and hiking: Secondary | ICD-10-CM | POA: Diagnosis present

## 2021-07-27 DIAGNOSIS — I5033 Acute on chronic diastolic (congestive) heart failure: Secondary | ICD-10-CM | POA: Diagnosis present

## 2021-07-27 DIAGNOSIS — J9621 Acute and chronic respiratory failure with hypoxia: Secondary | ICD-10-CM | POA: Diagnosis present

## 2021-07-27 DIAGNOSIS — R5381 Other malaise: Secondary | ICD-10-CM | POA: Diagnosis present

## 2021-07-27 DIAGNOSIS — Z6841 Body Mass Index (BMI) 40.0 and over, adult: Secondary | ICD-10-CM | POA: Diagnosis not present

## 2021-07-27 DIAGNOSIS — J449 Chronic obstructive pulmonary disease, unspecified: Secondary | ICD-10-CM | POA: Diagnosis present

## 2021-07-27 DIAGNOSIS — J9622 Acute and chronic respiratory failure with hypercapnia: Secondary | ICD-10-CM | POA: Diagnosis present

## 2021-07-27 DIAGNOSIS — G8929 Other chronic pain: Secondary | ICD-10-CM | POA: Diagnosis present

## 2021-07-27 DIAGNOSIS — Z87891 Personal history of nicotine dependence: Secondary | ICD-10-CM | POA: Diagnosis not present

## 2021-07-27 DIAGNOSIS — Z886 Allergy status to analgesic agent status: Secondary | ICD-10-CM | POA: Diagnosis not present

## 2021-07-27 DIAGNOSIS — Y92009 Unspecified place in unspecified non-institutional (private) residence as the place of occurrence of the external cause: Secondary | ICD-10-CM | POA: Diagnosis not present

## 2021-07-27 LAB — CBC
HCT: 37.7 % — ABNORMAL LOW (ref 39.0–52.0)
Hemoglobin: 11.4 g/dL — ABNORMAL LOW (ref 13.0–17.0)
MCH: 27.3 pg (ref 26.0–34.0)
MCHC: 30.2 g/dL (ref 30.0–36.0)
MCV: 90.2 fL (ref 80.0–100.0)
Platelets: 178 10*3/uL (ref 150–400)
RBC: 4.18 MIL/uL — ABNORMAL LOW (ref 4.22–5.81)
RDW: 16.4 % — ABNORMAL HIGH (ref 11.5–15.5)
WBC: 6.7 10*3/uL (ref 4.0–10.5)
nRBC: 0 % (ref 0.0–0.2)

## 2021-07-27 LAB — HEPARIN LEVEL (UNFRACTIONATED)
Heparin Unfractionated: 0.26 IU/mL — ABNORMAL LOW (ref 0.30–0.70)
Heparin Unfractionated: 0.28 IU/mL — ABNORMAL LOW (ref 0.30–0.70)

## 2021-07-27 LAB — ECHOCARDIOGRAM COMPLETE
Area-P 1/2: 2.76 cm2
S' Lateral: 3.5 cm

## 2021-07-27 MED ORDER — PERFLUTREN LIPID MICROSPHERE
1.0000 mL | INTRAVENOUS | Status: AC | PRN
  Administered 2021-07-27: 2 mL via INTRAVENOUS
  Filled 2021-07-27: qty 10

## 2021-07-27 MED ORDER — HEPARIN SODIUM (PORCINE) 5000 UNIT/ML IJ SOLN
5000.0000 [IU] | Freq: Three times a day (TID) | INTRAMUSCULAR | Status: DC
  Administered 2021-07-27 – 2021-08-11 (×45): 5000 [IU] via SUBCUTANEOUS
  Filled 2021-07-27 (×44): qty 1

## 2021-07-27 NOTE — Progress Notes (Addendum)
Occupational Therapy Evaluation Patient Details Name: Hunter King MRN: [id number] DOB: 1966-05-30 Today's Date: 07/27/2021    History of Present Illness Pt is a 55yo male who presents to [facility] ED on 8/10 secondary to recent syncope on 8/9. Of note, was seen at [facility] ED 8/9 and evaluated for left knee pain but pt and family did not feel that syncopal episode was appropriately evaluated. Noted to be hypoxic. CTA of Chest showed mild diffuse ground-glass opacities suggestive of atelectasis or mild  edema. PMH: chronic lymphodema, DM, Morbid obesity, R knee surgery, bradycardia, anxiety, depression.   Clinical Impression   PTA pt lives at home with his wife and son. States he has not been out of the house in 3 years and has care by a [facility] nurse. Pt's nephew is in process of having a ramp built for the mobile home as pt is unable to navigate the steps. Pt able to walk short distances at baseline. Completes his self care using compensatory strategies but is unable to reach his feet and has difficulty with skin care. Apparent MASD with several areas of skin breakdown noted in multiple skin folds with foul odor. Some areas look fungal. Recommend further assessment of these areas by [facility] nurse.  Pt unable to progress to stand and required +3 max A to mobilize to EOB. Given high risk of decompensation recommend use of Tilt bed to increase early mobility. Discussed with MD and will place order tomorrow. Pt will most likely need rehab at [facility] prior to DC home. Will follow acutely to maximize functional level of independence with mobility and ADL with goal to DC home.  Desat to 87 on 2 L with bed mobility with 3/4 DOE.    Follow Up Recommendations  CIR    Equipment Recommendations  Other (comment) (tilt Bed)    Recommendations for Other Services Rehab consult     Precautions / Restrictions Precautions Precautions: Fall Precaution Comments: OCD; agoraphobic; skin breakdown      Mobility Bed  Mobility Overal bed mobility: Needs Assistance Bed Mobility: Supine to Sit;Sit to Supine     Supine to sit: Max assist (+3) Sit to supine: Max assist (+3)   General bed mobility comments: Pt required max Assist of +3 for supine to sit for trunk control and scooting to edge of bed. Pt with posterior lean in sitting and required at least mod A to maintain sitting balance. Pt required max assist of +3 for sit to supine for trunk control and LE advancement into bed. Pt was able to use BUE and bridging to scoot up in bed while bed was in declined position.    Transfers                 General transfer comment: Deferred; Pt unable this date    Balance Overall balance assessment: Needs assistance Sitting-balance support: Bilateral upper extremity supported;Feet supported Sitting balance-Leahy Scale: Zero Sitting balance - Comments: Heavy posterior lean reliant on at least mod A. Postural control: Posterior lean                                 ADL either performed or assessed with clinical judgement   ADL Overall ADL's : Needs assistance/impaired     Grooming: Set up   Upper Body Bathing: Set up;Bed level   Lower Body Bathing: Maximal assistance;Bed level   Upper Body Dressing : Maximal assistance   Lower  Body Dressing: Maximal assistance;Bed level Lower Body Dressing Details (indicate cue type and reason): donned underwear @ bed level rooling side/side and bridging   Toilet Transfer Details (indicate cue type and reason): unable to ambulate at this time Toileting- Clothing Manipulation and Hygiene: Minimal assistance Toileting - Clothing Manipulation Details (indicate cue type and reason): urinal use     Functional mobility during ADLs: Maximal assistance (+3)       Vision         Perception     Praxis      Pertinent Vitals/Pain Pain Assessment: 0-10 Pain Score: 8  Pain Location: left knee, secondary to fall in bathroom; L shoulder Pain  Descriptors / Indicators: Grimacing;Guarding Pain Intervention(s): Limited activity within patient's tolerance;Repositioned     Hand Dominance Right   Extremity/Trunk Assessment Upper Extremity Assessment Upper Extremity Assessment: LUE deficits/detail LUE Deficits / Details: L shoulder ROM to @ 90; discomfort over 90; elbow/wrist/hadn WFLq   Lower Extremity Assessment Lower Extremity Assessment: Defer to PT evaluation RLE Deficits / Details: Pt has open sores on posterior calf that are actively draining. Pt's general LE ROM is restricted per visual asssesment in supine. Pt able to bridge to don underwear with multiple attempts and assist with clothing.   Cervical / Trunk Assessment Cervical / Trunk Assessment: Other exceptions Cervical / Trunk Exceptions: increased body habitus   Communication Communication Communication: No difficulties   Cognition Arousal/Alertness: Awake/alert Behavior During Therapy: Anxious Overall Cognitive Status: Within Functional Limits for tasks assessed                                 General Comments: Anxiety noted with movement   General Comments       Exercises     Shoulder Instructions      Home Living Family/patient expects to be discharged to:: Private residence Living Arrangements: Spouse/significant other;Children Available Help at Discharge: Family;Available 24 hours/day Type of Home: Mobile home Home Access: Stairs to enter [employer] of Steps: 4; nephew working on building a ramp Entrance Stairs-Rails: Right Home Layout: One level     Bathroom Shower/Tub: Chief Strategy Officer: Handicapped height Bathroom Accessibility: No   Home Equipment: None          Prior Functioning/Environment Level of Independence: Needs assistance  Gait / Transfers Assistance Needed: Difficulty walking; reports that bed to bathroom is 40 steps and that's a long distance for him. ADL's / Homemaking  Assistance Needed: Pt completes ADL sitting on toilet and has his own system of completing his tasks. Unable to reach feet; Never wears socks, only slip on shoesUses a plunger for bathing and dressing. States it takes him 3 hours to bath/dress   Comments: Pt reports not having left the house for three years.        OT Problem List: Decreased strength;Decreased range of motion;Decreased activity tolerance;Impaired balance (sitting and/or standing);Decreased cognition;Decreased safety awareness;Decreased knowledge of use of DME or AE;Decreased knowledge of precautions;Obesity;Impaired UE functional use;Pain      OT Treatment/Interventions: Self-care/ADL training;Therapeutic exercise;Energy conservation;DME and/or AE instruction;Therapeutic activities;Patient/family education;Balance training    OT Goals(Current goals can be found in the care plan section) Acute Rehab OT Goals Patient Stated Goal: to determine why he fell OT Goal Formulation: With patient Time For Goal Achievement: 08/10/21 Potential to Achieve Goals: Good  OT Frequency: Min 2X/week   Barriers to D/C:  Co-evaluation PT/OT/SLP Co-Evaluation/Treatment: Yes Reason for Co-Treatment: Complexity of the patient's impairments (multi-system involvement);For patient/therapist safety;To address functional/ADL transfers   OT goals addressed during session: ADL's and self-care      AM-PAC OT "6 Clicks" Daily Activity     Outcome Measure Help from another person eating meals?: None Help from another person taking care of personal grooming?: A Little Help from another person toileting, which includes using toliet, bedpan, or urinal?: Total Help from another person bathing (including washing, rinsing, drying)?: A Lot Help from another person to put on and taking off regular upper body clothing?: A Lot Help from another person to put on and taking off regular lower body clothing?: A Lot 6 Click Score: 14   End of  Session Equipment Utilized During Treatment: Oxygen (2L) Nurse Communication: Mobility status;Need for lift equipment;Precautions;Weight bearing status  Activity Tolerance: Patient tolerated treatment well Patient left: in bed;with call bell/phone within reach  OT Visit Diagnosis: Other abnormalities of gait and mobility (R26.89);Muscle weakness (generalized) (M62.81);History of falling (Z91.81);Pain Pain - Right/Left: Left Pain - part of body: Shoulder;Knee                Time: 6606-3016 OT Time Calculation (min): 34 min Charges:  OT General Charges $OT Visit: 1 Visit OT Evaluation $OT Eval Moderate Complexity: 1 Mod  Danette Weinfeld, OT/L   Acute OT Clinical Specialist Acute Rehabilitation Services Pager [phone] Office [phone]   [facility] 07/27/2021, 3:43 PM

## 2021-07-27 NOTE — Progress Notes (Signed)
  Echocardiogram 2D Echocardiogram has been performed.  Hunter King 07/27/2021, 12:09 PM

## 2021-07-27 NOTE — Consult Note (Addendum)
NAME:  Hunter King, MRN:  [id number], DOB:  1966-03-23, LOS: 0 ADMISSION DATE:  07/26/2021, CONSULTATION DATE:  07/27/2021  REFERRING MD: Dr. Madelyn Flavors CHIEF COMPLAINT: Hypercapnia/hypoxia  History of present illness   Mr. Hunter King is a 55 year old gentleman with a PMH of anxiety, depression, lymphedema and morbid obesity who presented via EMS after a fall at home.  Patient reports progressive dyspnea on exertion last few months.  On 8/9, he recalls getting up to go to the bathroom when he became short of breath and fatigue.  His legs became heavy and he fell while trying to hold onto the sink in the bathroom.  Reports feeling lightheaded prior to the fall.  Denies blacking out but unsure if he hit his head as he fell on his left side.  He was transported to [facility] via EMS but was discharged home after negative x-ray of left knee.  Patient reports not feeling comfortable at home so he called EMS to be transferred to [facility] ED.  He denies any history of lung disease but reports he smoked 1 pack/day of cigarettes for 8 years before quitting in 1997.  He has a history of snoring with occasional apneic episodes at night according to his wife.  He reports not getting a good night sleep and often feeling fatigued and sleepy during the day.  He denies any orthopnea, fevers, coughs, chest pain, abdominal pain, headaches or dizziness.  Reports a history of chronic back and knee pain.  He is usually sedentary but walks around without assistive device.  He has home health nurse and PT. He recently bought a pulse oximeter and has noticed that his O2 has been in the high 80s the last few weeks.  In the ED patient was found to be afebrile with a drop in O2 sats to 87, improved to 100% on 2 L [place].  D-dimer slightly elevated to 1.41 and a BNP of 230. CXR showed cardiac enlargement and ABG showed pH 7.39, PCO2 54.7, PO2 92, bicarb 33.  Pulmonology was consulted to evaluate for possible OSA/OHS.   Past  Medical History  He,  has a past medical history of Arthritis, Diabetes mellitus without complication (HCC), and Obesities, morbid (HCC).  Lymphedema, anxiety and depression  Consults:  Pulmonology  Procedures:  N/A  Significant Diagnostic Tests:  X-ray left knee 8/09 >> no fracture or dislocation CXR 8/10 >> cardiac enlargement but no cardiopulmonary disease CTA Chest 8/10 >> no evidence of PE.  Mild diffuse groundglass opacity suggestive of atelectasis or mild edema, no pleural effusion or pneumonia.  Micro Data:  Negative SARS coronavirus 2  Antimicrobials:  N/A  Objective   Blood pressure 129/68, pulse 68, temperature 98 F (36.7 C), temperature source Oral, resp. rate 16, SpO2 94 %.        Intake/Output Summary (Last 24 hours) at 07/27/2021 0804 Last data filed at 07/27/2021 0200 Gross per 24 hour  Intake 500 ml  Output 1400 ml  Net -900 ml   There were no vitals filed for this visit.  Examination: General: Pleasant, well-appearing, morbidly obese middle-age man laying in bed. No acute distress. HEENT: [place]/AT.  MMM.  Normal conjunctiva. Neck: Large neck size.  No JVD. CV: RRR. No murmurs, rubs, or gallops.  Pulmonary: Lungs CTAB.  Decreased air movement throughout lung fields.  No wheezing or rales. Abdominal: Morbidly obese.  Soft, nontender, nondistended. Normal bowel sounds. Extremities: Significant lymphedema of BLE with associated venous insufficiency wounds and small  drainage of serosanguineous fluids. Palpable radial pulses.  DP pulses difficult to assess due to lymphedema.  Normal ROM. Skin: Warm and dry.  Lymphedema of the lower extremities. Neuro: A&Ox3. Moves all extremities. Normal sensation. No focal deficit. Psych: Normal mood and affect   Resolved Hospital Problem list   N/A  Assessment & Plan:  Acute hypoxia Chronic hypercapnic respiratory failure Obesity hypoventilation syndrome  55 year old gentleman with morbid obesity presented to the ED  for evaluation after a fall and found to be hypoxic to the mid 80s on arrival.  ABG with elevated PCO2 and bicarb consistent with chronic hypercapnia respiratory failure in the setting of undiagnosed obesity hypoventilation syndrome secondary to his morbid obesity with a BMI of 83.  Acute hypoxia likely secondary to mildly fluid overloaded state in the setting of possible heart failure and pulmonary hypertension.  Patient has a STOP-BANG score of 6 putting him at high risk of OSA.  CTA negative for PE and BLE U/S negative for DVT. Patient currently hemodynamically stable with O2 sats above 95% on 2 L [place].  Patient would likely need a sleep study in the outpatient but due to morbid obesity and likely challenges with getting to sleep center, will start patient on BiPAP during this admission and discharged home on BiPAP.  --BiPAP at night --Continue supplemental O2, with sats goal of O2 88-92% --Continue PRN albuterol nebulizer solution --Follow-up echo to assess for possible HF and pulm HTN, diuresis per primary team --[facility] consult for BiPAP at discharge --Consider outpatient referral for bariatric surgery  Labs   CBC: Recent Labs  Lab 07/26/21 1414 07/26/21 1549 07/27/21 0400  WBC 6.4  --  6.7  NEUTROABS 4.8  --   --   HGB 12.2* 15.3 11.4*  HCT 40.7 45.0 37.7*  MCV 89.8  --  90.2  PLT 203  --  178    Basic Metabolic Panel: Recent Labs  Lab 07/26/21 1414 07/26/21 1549  NA 137 141  K 4.2 4.0  CL 101  --   CO2 28  --   GLUCOSE 105*  --   BUN 16  --   CREATININE 1.19  --   CALCIUM 8.1*  --    GFR: Estimated Creatinine Clearance: 169.8 mL/min (by C-G formula based on SCr of 1.19 mg/dL). Recent Labs  Lab 07/26/21 1414 07/27/21 0400  WBC 6.4 6.7    Liver Function Tests: Recent Labs  Lab 07/26/21 1414  AST 20  ALT 14  ALKPHOS 96  BILITOT 1.1  PROT 6.8  ALBUMIN 2.9*   No results for input(s): LIPASE, AMYLASE in the last 168 hours. No results for input(s): AMMONIA in the  last 168 hours.  ABG    Component Value Date/Time   PHART 7.390 07/26/2021 1549   PCO2ART 54.7 (H) 07/26/2021 1549   PO2ART 92 07/26/2021 1549   HCO3 33.2 (H) 07/26/2021 1549   TCO2 35 (H) 07/26/2021 1549   O2SAT 97.0 07/26/2021 1549     Coagulation Profile: Recent Labs  Lab 07/26/21 1600  INR 1.3*    Cardiac Enzymes: No results for input(s): CKTOTAL, CKMB, CKMBINDEX, TROPONINI in the last 168 hours.  HbA1C: No results found for: HGBA1C  CBG: No results for input(s): GLUCAP in the last 168 hours.  Review of Systems:   As stated in the HPI.  Surgical History    Past Surgical History:  Procedure Laterality Date   FINGER SURGERY Right    HERNIA REPAIR     HYDROCELE EXCISION /  REPAIR     KNEE SURGERY Right      Social History   reports that he has quit smoking. His smoking use included cigarettes. He has never used smokeless tobacco. He reports current alcohol use. He reports that he does not currently use drugs.  Smoked a pack per day for 8 years before quitting 1997.  Occasional marijuana use.  Social drinker with about 1 drink per month.  No other illicit drug use.  Lives with his wife and ambulates without assistive device.  Family History   His family history includes Fibromyalgia in his mother.   Allergies Allergies  Allergen Reactions   Ibuprofen Swelling   Naproxen Swelling   Neosporin Plus Max St Rash   Nsaids Rash     Home Medications  Prior to Admission medications   Medication Sig Start Date End Date Taking? Authorizing Provider  acetaminophen (TYLENOL) 325 MG tablet Take 650 mg by mouth every 6 (six) hours as needed for mild pain or headache.   Yes [provider]  furosemide (LASIX) 40 MG tablet Take 40 mg by mouth daily. 12/23/18  Yes [provider]  loratadine (CLARITIN) 10 MG tablet Take 1 tablet (10 mg total) by mouth daily. Patient taking differently: Take 10 mg by mouth daily as needed for allergies. 07/25/21 08/24/21 Yes  Carroll Sage, PA-C  mirtazapine (REMERON) 15 MG tablet Take 15 mg by mouth at bedtime as needed (sleep). 07/10/21  Yes [provider]  potassium chloride (K-DUR) 10 MEQ tablet Take 10 mEq by mouth daily. 11/27/18  Yes [provider]  sertraline (ZOLOFT) 100 MG tablet Take 150 mg by mouth daily. 06/28/21  Yes [provider]  clobetasol ointment (TEMOVATE) 0.05 % APPLY EXTERNALLY TO THE AFFECTED AREA TWICE DAILY Patient not taking: No sig reported 11/20/19   Vivi Barrack, DPM  mupirocin ointment (BACTROBAN) 2 % Apply 1 application topically 2 (two) times daily. Patient not taking: No sig reported 05/19/20   Vivi Barrack, DPM  urea (CARMOL) 40 % CREA Apply 1 application topically daily. To callus areas on bottom of feet Patient not taking: No sig reported 11/20/19   Vivi Barrack, DPM

## 2021-07-27 NOTE — Progress Notes (Signed)
ANTICOAGULATION CONSULT NOTE   Pharmacy Consult for Heparin Indication: pulmonary embolus  Allergies  Allergen Reactions   Ibuprofen Swelling   Naproxen Swelling   Neosporin Plus Max St Rash   Nsaids Rash    Patient Measurements:   Heparin Dosing Weight: 160.4 kgs  Vital Signs: BP: 111/70 (08/11 0200) Pulse Rate: 77 (08/11 0200)  Labs: Recent Labs    07/26/21 1414 07/26/21 1549 07/26/21 1600  HGB 12.2* 15.3  --   HCT 40.7 45.0  --   PLT 203  --   --   APTT  --   --  32  LABPROT  --   --  15.7*  INR  --   --  1.3*  CREATININE 1.19  --   --      Estimated Creatinine Clearance: 169.8 mL/min (by C-G formula based on SCr of 1.19 mg/dL).   Medical History: Past Medical History:  Diagnosis Date   Arthritis    Diabetes mellitus without complication (HCC)    Obesities, morbid (HCC)     Assessment: 55 YO obese male presenting after a fall at home when going to the bathroom. O2 sat was 87 on admission and was improved to 100 w/ 2L [place] oxygen. Labs significant for d-dimer 1.41 and BNP 229.8.   CT Angio in the ED is negative for PE, awaiting venous duplex for r/o DVT  Heparin level is low at 0.28  Goal of Therapy:  Heparin level 0.3-0.7 units/ml Monitor platelets by anticoagulation protocol: Yes   Plan:  Inc heparin to 2700 units/hr 1000 heparin level F/U venous duplex results   Abran Duke, PharmD, BCPS Clinical Pharmacist Phone: [phone]

## 2021-07-27 NOTE — Progress Notes (Signed)
Lower extremity venous bilateral study completed.   Please see CV Proc for preliminary results.   Jacqueline Spofford, RDMS, RVT  

## 2021-07-27 NOTE — ED Notes (Signed)
Echo at bedside

## 2021-07-27 NOTE — Consult Note (Signed)
WOC Nurse Consult Note: Reason for Consult:Bilateral lymphedema with partial thickness skin loss to posterior right LE Wound type: venous insufficiency, lymphedema Pressure Injury POA: N/A Measurement:12cm x 5cm area x 0.1cm area os pink wound bed with serous to light yellow exudate in a small amount Wound bed:As described above Drainage (amount, consistency, odor) As described above Periwound: edematous Dressing procedure/placement/frequency: Patient is managed by [facility] at home with twice weekly compression wraps and topical care to the posterior right LE.  I will continue that POC using cleansing of the legs with soap and water, rinse and pat dry followed by placement of xeroform gauze over the partial thickness skin loss to the right posterior LE twice weekly. Ortho Tech to place [employer] per house protocol twice weekly after Nursing performs wound care. Prevalon boots are added for floatation of the heels. A prophylactic foam dressing is to be placed. Patient is on a bariatric sleep surface with low air loss feature.  He would benefit from a PT evaluation and provision of a bariatric walker. If you agree, please order assessment when appropriate.  WOC nursing team will not follow, but will remain available to this patient, the nursing and medical teams.  Please re-consult if needed. Thanks, Ladona Mow, MSN, RN, GNP, [place]  Pager# [phone]

## 2021-07-27 NOTE — Telephone Encounter (Signed)
Morbidly obese, cannot be transported to office, currently hospitalized He has obesity hypoventilation and acute hypoxic respiratory failure requiring oxygen. Likely has OSA.  Please schedule home sleep test-order can be placed next week after he discharges from the hospital

## 2021-07-27 NOTE — Progress Notes (Signed)
PROGRESS NOTE    Hunter King  ZOX:[id number] DOB: Jul 23, 1966 DOA: 07/26/2021 PCP: Avis Epley, PA-C     Brief Narrative:  Hunter King is a 55 y.o. male with medical history significant of anxiety, depression, lymphedema, and morbid obesity presents after having a fall at home. Patient recalls getting up to go to the bathroom, his breathing was labored , and as he was stepping into the bathroom he felt his legs getting heavy prior to passing out.  After patient fell to the floor, he came to immediately but was unable to get up and complained of left knee pain.  Thereafter he was not able to get himself up and EMS ultimately had to be called.  He noted that he had to be dragged out of the house in order to be picked up to get into the ambulance.  Patient seen at [facility], but they only checked x-rays of his knee and discharged him home.  On family was upset as no blood work had been done and further investigation into why he passed out.  His sister gives additional history noticed that the patient has not been out of his house in over 3 years.  He is on 3 medications including Zoloft for anxiety/ depression/ OCD/ agoraphobia, mirtazapine for sleep/mood, and furosemide for chronic lymphedema of his legs.  He has lymphedema with chronic sores that weep, and currently bleeding due to having to be dragged out of his house by EMS.  His sister notes that he has  home health that comes out and change the dressings and wraps his leg.  At baseline patient is sedentary, but reportedly is able to get up and walk on his own.   New events last 24 hours / Subjective: Patient sitting in bed, has no complaints of chest pain or shortness of breath.  Assessment & Plan:   Principal Problem:   Acute respiratory failure with hypoxia and hypercapnia (HCC) Active Problems:   Syncope and collapse   Bradycardia   Cardiomegaly   Morbid obesity with BMI of 70 and over, adult (HCC)    Lymphedema   Debility   Anxiety and depression   Syncope   Acute respiratory failure with hypercapnia, hypoxemia, suspect obesity hypoventilation syndrome -Remains on 4.5L O2, wean as able -Appreciate PCCM, being set up for home sleep study  Syncope -Likely in setting of vasovagal response, deconditioning   Cardiomegaly, suspected CHF -BNP 229.8 -Echo pending -IV lasix  -Strict I/Os, daily weight   Elevated d-dimer -CTA chest negative for PE -DVT US negative for DVT  Debility -PT OT   Chronic lymphedema -Wound care consult   Anxiety and depression -Continue Zoloft and mirtazapine  Morbid obesity  Estimated body mass index is 83.46 kg/m as calculated from the following:   Height as of 07/25/21:  (1.88 m).   Weight as of 07/25/21: 294.8 kg.   DVT prophylaxis:  heparin injection 5,000 Units Start: 07/27/21 1400  Code Status:     Code Status Orders  (From admission, onward)           Start     Ordered   07/26/21 1724  Full code  Continuous        07/26/21 1726           Code Status History     This patient has a current code status but no historical code status.      Family Communication: None at bedside Disposition Plan:  Status  is: Inpatient  Remains inpatient appropriate because:Unsafe d/c plan, IV treatments appropriate due to intensity of illness or inability to take PO, and Inpatient level of care appropriate due to severity of illness  Dispo: The patient is from: Home              Anticipated d/c is to: SNF              Patient currently is not medically stable to d/c.   Difficult to place patient No      Consultants:  PCCM  Procedures:  None   Antimicrobials:  Anti-infectives (From admission, onward)    None        Objective: Vitals:   07/27/21 0500 07/27/21 0700 07/27/21 0800 07/27/21 1000  BP: 120/60 129/68 126/60 137/63  Pulse: 63 68 64 64  Resp: 17 16 16  (!) 23  Temp:      TempSrc:      SpO2: 92% 94% 94%  91%    Intake/Output Summary (Last 24 hours) at 07/27/2021 1242 Last data filed at 07/27/2021 1025 Gross per 24 hour  Intake 750 ml  Output 3400 ml  Net -2650 ml   There were no vitals filed for this visit.  Examination:  General exam: Appears calm and comfortable  Respiratory system: Clear to auscultation anteriorly. Respiratory effort normal. No respiratory distress. No conversational dyspnea. On [place] O2 Cardiovascular system: S1 & S2 heard, RRR. No murmurs. + pedal edema. Gastrointestinal system: Abdomen is nondistended, soft and nontender. Normal bowel sounds heard. Central nervous system: Alert and oriented. No focal neurological deficits. Speech clear.  Extremities: Symmetric in appearance  Skin: Weepy skin with chronic venous stasis changes bilaterally  Psychiatry: Judgement and insight appear normal. Mood & affect appropriate.   Data Reviewed: I have personally reviewed following labs and imaging studies  CBC: Recent Labs  Lab 07/26/21 1414 07/26/21 1549 07/27/21 0400  WBC 6.4  --  6.7  NEUTROABS 4.8  --   --   HGB 12.2* 15.3 11.4*  HCT 40.7 45.0 37.7*  MCV 89.8  --  90.2  PLT 203  --  178   Basic Metabolic Panel: Recent Labs  Lab 07/26/21 1414 07/26/21 1549  NA 137 141  K 4.2 4.0  CL 101  --   CO2 28  --   GLUCOSE 105*  --   BUN 16  --   CREATININE 1.19  --   CALCIUM 8.1*  --    GFR: Estimated Creatinine Clearance: 169.8 mL/min (by C-G formula based on SCr of 1.19 mg/dL). Liver Function Tests: Recent Labs  Lab 07/26/21 1414  AST 20  ALT 14  ALKPHOS 96  BILITOT 1.1  PROT 6.8  ALBUMIN 2.9*   No results for input(s): LIPASE, AMYLASE in the last 168 hours. No results for input(s): AMMONIA in the last 168 hours. Coagulation Profile: Recent Labs  Lab 07/26/21 1600  INR 1.3*   Cardiac Enzymes: No results for input(s): CKTOTAL, CKMB, CKMBINDEX, TROPONINI in the last 168 hours. BNP (last 3 results) No results for input(s): PROBNP in the last 8760  hours. HbA1C: No results for input(s): HGBA1C in the last 72 hours. CBG: No results for input(s): GLUCAP in the last 168 hours. Lipid Profile: No results for input(s): CHOL, HDL, LDLCALC, TRIG, CHOLHDL, LDLDIRECT in the last 72 hours. Thyroid Function Tests: Recent Labs    07/26/21 1955  TSH 2.622   Anemia Panel: No results for input(s): VITAMINB12, FOLATE, FERRITIN, TIBC, IRON, RETICCTPCT in the  last 72 hours. Sepsis Labs: No results for input(s): PROCALCITON, LATICACIDVEN in the last 168 hours.  Recent Results (from the past 240 hour(s))  Resp Panel by RT-PCR (Flu A&B, Covid) Nasopharyngeal Swab     Status: None   Collection Time: 07/26/21  3:28 PM   Specimen: Nasopharyngeal Swab; Nasopharyngeal(NP) swabs in vial transport medium  Result Value Ref Range Status   SARS Coronavirus 2 by RT PCR NEGATIVE NEGATIVE Final    Comment: (NOTE) SARS-CoV-2 target nucleic acids are NOT DETECTED.  The SARS-CoV-2 RNA is generally detectable in upper respiratory specimens during the acute phase of infection. The lowest concentration of SARS-CoV-2 viral copies this assay can detect is 138 copies/mL. A negative result does not preclude SARS-Cov-2 infection and should not be used as the sole basis for treatment or other patient management decisions. A negative result may occur with  improper specimen collection/handling, submission of specimen other than nasopharyngeal swab, presence of viral mutation(s) within the areas targeted by this assay, and inadequate number of viral copies(<138 copies/mL). A negative result must be combined with clinical observations, patient history, and epidemiological information. The expected result is Negative.  Fact Sheet for Patients:  BloggerCourse.com  Fact Sheet for Healthcare Providers:  SeriousBroker.it  This test is no t yet approved or cleared by the [place] FDA and  has been authorized for  detection and/or diagnosis of SARS-CoV-2 by FDA under an Emergency Use Authorization (EUA). This EUA will remain  in effect (meaning this test can be used) for the duration of the COVID-19 declaration under Section 564(b)(1) of the Act, 21 U.S.C.section 360bbb-3(b)(1), unless the authorization is terminated  or revoked sooner.       Influenza A by PCR NEGATIVE NEGATIVE Final   Influenza B by PCR NEGATIVE NEGATIVE Final    Comment: (NOTE) The Xpert Xpress SARS-CoV-2/FLU/RSV plus assay is intended as an aid in the diagnosis of influenza from Nasopharyngeal swab specimens and should not be used as a sole basis for treatment. Nasal washings and aspirates are unacceptable for Xpert Xpress SARS-CoV-2/FLU/RSV testing.  Fact Sheet for Patients: BloggerCourse.com  Fact Sheet for Healthcare Providers: SeriousBroker.it  This test is not yet approved or cleared by the [place] FDA and has been authorized for detection and/or diagnosis of SARS-CoV-2 by FDA under an Emergency Use Authorization (EUA). This EUA will remain in effect (meaning this test can be used) for the duration of the COVID-19 declaration under Section 564(b)(1) of the Act, 21 U.S.C. section 360bbb-3(b)(1), unless the authorization is terminated or revoked.  Performed at [facility], [address], [place], [place] [address]       Radiology Studies: DG Chest 1 View  Result Date: 07/26/2021 CLINICAL DATA:  Dyspnea.  Leg swelling. EXAM: CHEST  1 VIEW COMPARISON:  None. FINDINGS: Cardiac enlargement. No pleural effusion or edema. No airspace opacities identified. The visualized osseous structures are unremarkable. IMPRESSION: 1. Cardiac enlargement. 2. No acute findings. Electronically Signed   By: Signa Kell M.D.   On: 07/26/2021 14:52   CT Angio Chest PE W and/or Wo Contrast  Result Date: 07/26/2021 CLINICAL DATA:  Concern for pulmonary embolism.  Short  of breath. EXAM: CT ANGIOGRAPHY CHEST WITH CONTRAST TECHNIQUE: Multidetector CT imaging of the chest was performed using the standard protocol during bolus administration of intravenous contrast. Multiplanar CT image reconstructions and MIPs were obtained to evaluate the vascular anatomy. CONTRAST:  OMNIPAQUE IOHEXOL 350 MG/ML SOLN COMPARISON:  None. FINDINGS: Cardiovascular: No filling defects  within the pulmonary arteries to suggest acute pulmonary embolism. Mild moderate image degradation related to body habitus and respiratory motion. Mediastinum/Nodes: No axillary or supraclavicular adenopathy. 15 mm lymph node adjacent to the bronchus intermedius (image 48/5) no pericardial fluid. Esophagus normal. Lungs/Pleura: No pulmonary infarction. No pneumonia. Ground-glass opacities in the upper lobes in a mosaic pattern. No pleural fluid. No pneumothorax. Upper Abdomen: Limited view of the liver, kidneys, pancreas are unremarkable. Normal adrenal glands. Musculoskeletal: No aggressive osseous lesion. Degenerative osteophytosis of the spine. Review of the MIP images confirms the above findings. IMPRESSION: 1. No evidence acute pulmonary embolism. Mild-to-moderate image degradation as above. 2. Mild diffuse ground-glass opacities suggest atelectasis or mild edema. 3. No pleural fluid or pneumonia. 4. Single subcarinal node is favored reactive. Electronically Signed   By: Genevive Bi M.D.   On: 07/26/2021 18:14   VAS [place] LOWER EXTREMITY VENOUS (DVT)  Result Date: 07/27/2021  Lower Venous DVT Study Patient Name:  Hunter King [facility]  Date of Exam:   07/27/2021 Medical Rec #: [id number]         Accession #:    [id number] Date of Birth: Sep 20, 1966         Patient Gender: M Patient Age:   31 years Exam Location:  [facility] Procedure:      VAS [place] LOWER EXTREMITY VENOUS (DVT) Referring Phys: Madelyn Flavors --------------------------------------------------------------------------------  Indications: SOB,  elevated d-dimer.  Limitations: Body habitus, morbid obesity, and poor ultrasound/tissue interface due to skin changes. Comparison Study: 07-26-2021 CTA chest was negative for PE. Performing Technologist: Jean Rosenthal RDMS,RVT  Examination Guidelines: A complete evaluation includes B-mode imaging, spectral Doppler, color Doppler, and power Doppler as needed of all accessible portions of each vessel. Bilateral testing is considered an integral part of a complete examination. Limited examinations for reoccurring indications may be performed as noted. The reflux portion of the exam is performed with the patient in reverse Trendelenburg.  +---------+---------------+---------+-----------+----------+--------------+ RIGHT    CompressibilityPhasicitySpontaneityPropertiesThrombus Aging +---------+---------------+---------+-----------+----------+--------------+ CFV      Full           Yes      Yes                                 +---------+---------------+---------+-----------+----------+--------------+ SFJ      Full                                                        +---------+---------------+---------+-----------+----------+--------------+ FV Prox  Full                                                        +---------+---------------+---------+-----------+----------+--------------+ FV Mid                  Yes      Yes                                 +---------+---------------+---------+-----------+----------+--------------+ FV Distal               Yes  Yes                                 +---------+---------------+---------+-----------+----------+--------------+ PFV      Full                                                        +---------+---------------+---------+-----------+----------+--------------+ POP                     Yes      Yes                                 +---------+---------------+---------+-----------+----------+--------------+ PTV                      Yes      Yes                                 +---------+---------------+---------+-----------+----------+--------------+ PERO                                                  Not visualized +---------+---------------+---------+-----------+----------+--------------+   +---------+---------------+---------+-----------+----------+--------------+ LEFT     CompressibilityPhasicitySpontaneityPropertiesThrombus Aging +---------+---------------+---------+-----------+----------+--------------+ CFV      Full           Yes      Yes                                 +---------+---------------+---------+-----------+----------+--------------+ SFJ      Full                                                        +---------+---------------+---------+-----------+----------+--------------+ FV Prox  Full                                                        +---------+---------------+---------+-----------+----------+--------------+ FV Mid                  Yes      Yes                                 +---------+---------------+---------+-----------+----------+--------------+ FV Distal               Yes      Yes                                 +---------+---------------+---------+-----------+----------+--------------+ PFV      Full                                                        +---------+---------------+---------+-----------+----------+--------------+  POP      Full           Yes      Yes                                 +---------+---------------+---------+-----------+----------+--------------+ PTV                     Yes      Yes                                 +---------+---------------+---------+-----------+----------+--------------+ PERO                    Yes      Yes                                 +---------+---------------+---------+-----------+----------+--------------+    Summary: RIGHT: - There is no evidence of deep vein  thrombosis in the lower extremity. However, portions of this examination were limited- see technologist comments above.  - No cystic structure found in the popliteal fossa.  LEFT: - There is no evidence of deep vein thrombosis in the lower extremity. However, portions of this examination were limited- see technologist comments above.  - No cystic structure found in the popliteal fossa.  *See table(s) above for measurements and observations.    Preliminary       Scheduled Meds:  furosemide  40 mg Intravenous BID   heparin injection (subcutaneous)  5,000 Units Subcutaneous Q8H   mirtazapine  15 mg Oral QHS   sertraline  150 mg Oral Daily   sodium chloride flush  3 mL Intravenous Q12H   Continuous Infusions:   LOS: 0 days      Time spent: 30 minutes   Noralee Stain, DO [facility] 07/27/2021, 12:42 PM   Available via Epic secure chat 7am-7pm After these hours, please refer to coverage provider listed on [email or web address]

## 2021-07-27 NOTE — Progress Notes (Addendum)
I agree with the following treatment note after review of the documentation. This session was performed under the supervision of a licensed clinician.   Cindee Salt, DPT  Acute Rehabilitation Services  Pager: [phone]   Physical Therapy Evaluation Patient Details Name: Hunter King MRN: [id number] DOB: July 08, 1966 Today's Date: 07/27/2021   History of Present Illness  Pt is a 55yo male who [facility] to [facility] ED on 8/10 secondary to recent syncope on 8/9. Of note, was seen at [facility] ED 8/9 and evaluated for left knee pain but pt and family did not feel that syncopal episode was appropriately evaluated. CTA of Chest showed mild diffuse ground-glass opacities suggestive of atelectasis or mild  edema. PMH: chronic lymphodema, DM, Morbid obesity, R knee surgery, bradycardia, anxiety, depression.   Clinical Impression  Pt presents with the impairments above and problems listed below. Pt required max assist of +3 for bed mobility and demonstrated posterior lean that required at least mod assist from PT. Noted skin breakdown under pannus, RN notified. Pt was educated on daily diabetic foot care. Pt reports having supervision level of help at home from family and is currently receiving HHRN 2x weekly. Recommending CIR-level therapies to address current impairments. We will continue to follow the patient acutely to promote independence with functional mobility.    Follow Up Recommendations CIR   Equipment Recommendations  Other (comment);Hospital bed (Bariatric walker, bariatric BSC)    Recommendations for Other Services       Precautions / Restrictions Precautions Precautions: Fall Restrictions Weight Bearing Restrictions: No      Mobility  Bed Mobility Overal bed mobility: Needs Assistance Bed Mobility: Supine to Sit;Sit to Supine     Supine to sit: Max assist (+3) Sit to supine: Max assist (+3)   General bed mobility comments: Pt required max Assist of +3 for supine to sit  for trunk control and scooting to edge of bed. Pt with posterior lean in sitting and required at least mod A to maintain sitting balance. Pt required max assist of +3 for sit to supine for trunk control and LE advancement into bed. Pt was able to use BUE and bridging to scoot up in bed while bed was in declined position.    Transfers                 General transfer comment: Deferred  Ambulation/Gait             General Gait Details: Deferred  Stairs            Wheelchair Mobility    Modified Rankin (Stroke Patients Only)       Balance Overall balance assessment: Needs assistance Sitting-balance support: Bilateral upper extremity supported;Feet supported Sitting balance-Leahy Scale: Zero Sitting balance - Comments: Heavy posterior lean reliant on at least mod A. Postural control: Posterior lean                                   Pertinent Vitals/Pain Pain Assessment: 0-10 Pain Score: 8  Pain Location: left knee, secondary to fall in bathroom Pain Descriptors / Indicators: Grimacing;Guarding Pain Intervention(s): Limited activity within patient's tolerance;Repositioned    Home Living Family/patient expects to be discharged to:: Private residence Living Arrangements: Spouse/significant other;Children Available Help at Discharge: Family;Available 24 hours/day Type of Home: Mobile home Home Access: Stairs to enter Entrance Stairs-Rails: Right Entrance Stairs-Number of Steps: 4; nephew working on building a ramp  Home Layout: One level Home Equipment: None      Prior Function Level of Independence: Needs assistance   Gait / Transfers Assistance Needed: Difficulty walking; reports that bed to bathroom is 40 steps and that's a long distance for him.  ADL's / Homemaking Assistance Needed: Pt completes ADL sitting on toilet and has his own system of completing his tasks. Uses a plunger for bathing and dressing.  Comments: Pt reports not having  left the house for three years.     Hand Dominance        Extremity/Trunk Assessment   Upper Extremity Assessment Upper Extremity Assessment: Defer to OT evaluation    Lower Extremity Assessment Lower Extremity Assessment: Generalized weakness;RLE deficits/detail;LLE deficits/detail RLE Deficits / Details: Pt has open sores on posterior calf that are actively draining. Pt's general LE ROM is restricted per visual asssesment in supine. Pt able to bridge to don underwear with multiple attempts and assist with clothing. RLE Sensation: WNL LLE Deficits / Details: Pt's general LE ROM is restricted per visual asssesment in supine. Pt able to bridge to don underwear with multiple attempts and assist with clothing. LLE Sensation: WNL    Cervical / Trunk Assessment Cervical / Trunk Assessment: Other exceptions Cervical / Trunk Exceptions: increased body habitus  Communication   Communication: No difficulties  Cognition Arousal/Alertness: Awake/alert Behavior During Therapy: Anxious Overall Cognitive Status: Within Functional Limits for tasks assessed                                 General Comments: Anxiety noted with movement      General Comments General comments (skin integrity, edema, etc.): Pt's skin was inspected on anterior and posterior trunk under pannus and found to be moist with areas of redness and yeast-like appearance. RN notified    Exercises     Assessment/Plan    PT Assessment Patient needs continued PT services  PT Problem List Decreased strength;Decreased range of motion;Decreased activity tolerance;Decreased balance;Decreased mobility;Decreased coordination;Obesity;Decreased skin integrity;Pain       PT Treatment Interventions DME instruction;Gait training;Stair training;Functional mobility training;Therapeutic activities;Therapeutic exercise;Balance training;Wheelchair mobility training    PT Goals (Current goals can be found in the Care Plan  section)  Acute Rehab PT Goals Patient Stated Goal: to determine why he fell PT Goal Formulation: With patient Time For Goal Achievement: 08/10/21 Potential to Achieve Goals: Fair    Frequency Min 2X/week   Barriers to discharge Inaccessible home environment      Co-evaluation PT/OT/SLP Co-Evaluation/Treatment: Yes Reason for Co-Treatment: For patient/therapist safety;To address functional/ADL transfers PT goals addressed during session: Mobility/safety with mobility         AM-PAC PT "6 Clicks" Mobility  Outcome Measure Help needed turning from your back to your side while in a flat bed without using bedrails?: A Lot Help needed moving from lying on your back to sitting on the side of a flat bed without using bedrails?: Total Help needed moving to and from a bed to a chair (including a wheelchair)?: Total Help needed standing up from a chair using your arms (e.g., wheelchair or bedside chair)?: Total Help needed to walk in hospital room?: Total Help needed climbing 3-5 steps with a railing? : Total 6 Click Score: 7    End of Session Equipment Utilized During Treatment: Oxygen (2L via [place]) Activity Tolerance: Patient limited by fatigue;Patient limited by pain Patient left: in bed;with call bell/phone within reach (on bariatric  bed in ED) Nurse Communication: Mobility status;Other (comment) (Notifed RN of skin issues under pannus) PT Visit Diagnosis: History of falling (Z91.81);Muscle weakness (generalized) (M62.81);Difficulty in walking, not elsewhere classified (R26.2)    Time: 1030-1102 PT Time Calculation (min) (ACUTE ONLY): 32 min   Charges:   PT Evaluation $PT Eval Moderate Complexity: 1 Mod          Johnn Hai, SPT  Johnn Hai 07/27/2021, 1:41 PM

## 2021-07-27 NOTE — Progress Notes (Signed)
Inpatient Rehab Admissions Coordinator Note:   Per PT recommendations, pt was screened for CIR candidacy by Estill Dooms, PT, DPT.  At this time OT eval pending and pt limited to EOB mobility on PT eval.  Would like to see pt mobilizing out of bed to demonstrate tolerance for CIR level therapies.  Will follow from a distance, but will not place a consult at this time.  Please contact me with questions.   Estill Dooms, PT, DPT [phone] 07/27/21 3:55 PM

## 2021-07-27 NOTE — ED Notes (Signed)
Pt given drink 

## 2021-07-28 DIAGNOSIS — J9601 Acute respiratory failure with hypoxia: Secondary | ICD-10-CM | POA: Diagnosis not present

## 2021-07-28 DIAGNOSIS — J9602 Acute respiratory failure with hypercapnia: Secondary | ICD-10-CM | POA: Diagnosis not present

## 2021-07-28 LAB — BASIC METABOLIC PANEL
Anion gap: 6 (ref 5–15)
BUN: 14 mg/dL (ref 6–20)
CO2: 34 mmol/L — ABNORMAL HIGH (ref 22–32)
Calcium: 8 mg/dL — ABNORMAL LOW (ref 8.9–10.3)
Chloride: 98 mmol/L (ref 98–111)
Creatinine, Ser: 1.25 mg/dL — ABNORMAL HIGH (ref 0.61–1.24)
GFR, Estimated: >60 mL/min (ref >60)
Glucose, Bld: 113 mg/dL — ABNORMAL HIGH (ref 70–99)
Potassium: 3.9 mmol/L (ref 3.5–5.1)
Sodium: 138 mmol/L (ref 135–145)

## 2021-07-28 NOTE — Progress Notes (Signed)
NAME:  Hunter King, MRN:  [id number], DOB:  12/27/1965, LOS: 1 ADMISSION DATE:  07/26/2021, CONSULTATION DATE:  07/27/21 REFERRING MD:  Katrinka Blazing CHIEF COMPLAINT:  Hypercapnia/hypoxia   History of Present Illness:  Mr. Hunter King is a 55 year old gentleman with a PMH of anxiety, depression, lymphedema and morbid obesity who presented via EMS after a fall at home.  Patient reports progressive dyspnea on exertion last few months.  On 8/9, he recalls getting up to go to the bathroom when he became short of breath and fatigue.  His legs became heavy and he fell while trying to hold onto the sink in the bathroom.  Reports feeling lightheaded prior to the fall.  Denies blacking out but unsure if he hit his head as he fell on his left side.  He was transported to [facility] via EMS but was discharged home after negative x-ray of left knee.  Patient reports not feeling comfortable at home so he called EMS to be transferred to [facility] ED.  He denies any history of lung disease but reports he smoked 1 pack/day of cigarettes for 8 years before quitting in 1997.  He has a history of snoring with occasional apneic episodes at night according to his wife.  He reports not getting a good night sleep and often feeling fatigued and sleepy during the day.  He denies any orthopnea, fevers, coughs, chest pain, abdominal pain, headaches or dizziness.  Reports a history of chronic back and knee pain.  He is usually sedentary but walks around without assistive device.  He has home health nurse and PT. He recently bought a pulse oximeter and has noticed that his O2 has been in the high 80s the last few weeks.   In the ED patient was found to be afebrile with a drop in O2 sats to 87, improved to 100% on 2 L [place].  D-dimer slightly elevated to 1.41 and a BNP of 230. CXR showed cardiac enlargement and ABG showed pH 7.39, PCO2 54.7, PO2 92, bicarb 33.  Pulmonology was consulted to evaluate for possible OSA/OHS.   Pertinent   Medical History  He,  has a past medical history of Arthritis, Diabetes mellitus without complication (HCC), and Obesities, morbid (HCC).  Lymphedema, anxiety and depression  Significant Hospital Events: Including procedures, antibiotic start and stop dates in addition to other pertinent events   X-ray left knee 8/09 >> no fracture or dislocation CXR 8/10 >> cardiac enlargement but no cardiopulmonary disease CTA Chest 8/10 >> no evidence of PE.  Mild diffuse groundglass opacity suggestive of atelectasis or mild edema, no pleural effusion or pneumonia. ECHO 8/11 >> EF 65-70%, moderate LVH, G2DD, mildly elevated PASP, no valvular abnormalities.  Interim History / Subjective:  Per RT note, patient received CPAP.  Per patient, RT informed him that he does not need BiPAP and will need a sleep study in order to qualify for one. Patient also states CPAP was not offered. Reports feeling well but frustrated with the miscommunication with BiPAP therapy. Significant UOP of 7 L in the last 24 hrs. On 4 LNC.   Objective   Blood pressure 140/61, pulse 64, temperature 98.4 F (36.9 C), resp. rate 18, SpO2 96 %.        Intake/Output Summary (Last 24 hours) at 07/28/2021 0817 Last data filed at 07/28/2021 0321 Gross per 24 hour  Intake 755.24 ml  Output 7175 ml  Net -6419.76 ml   There were no vitals filed for this visit.  Examination: General: Pleasant, well-appearing, morbidly obese middle-age man laying in bed. No acute distress. CV: RRR. No murmurs, rubs, or gallops.  Pulmonary: Lungs CTAB.  Decreased air movement throughout lung fields.  No wheezing or rales. Abdominal: Morbidly obese. Soft. NT/ND Extremities: Significant lymphedema of BLE. Venous stasis wounds covered with Roland Rack boot.  Skin: Warm and dry.  Lymphedema of the lower extremities. Neuro: A&Ox3. Moves all extremities. Normal sensation. No focal deficit. Psych: Normal mood and affect  Resolved Hospital Problem list    N/A  Assessment & Plan:  Acute hypoxic respiratory failure Chronic hypercapnic respiratory failure Obesity hypoventilation syndrome Diastolic HF w/ mild elevated PASP    55 year old gentleman with morbid obesity presented to the ED for evaluation after a fall and found to be hypoxic to the mid 80s on arrival.  ABG with elevated PCO2 and bicarb consistent with chronic hypercapnia respiratory failure in the setting of undiagnosed obesity hypoventilation syndrome secondary to his morbid obesity with a BMI of 83. CTA negative for PE and BLE U/S negative for DVT. Echo 8/11 shows normal EF but moderate LVH and G2DD which is likely contributing to his hypoxia. Diuresing well with UOP of 7 L in the last 24 hrs. patient likely has OSA with a STOP-BANG score of 6.  Patient has a STOP-BANG score of 6 putting him at high risk of OSA.  In the setting of OHS and OSA, patient needs a positive airway pressure therapy during this hospitalization and at discharge.  --BiPAP or CPAP at night --Continue supplemental O2, wean off as tolerated with sats goal of O2 88-92% --Continue PRN albuterol, nebulizer solution --Continue diuresis per primary team --Patient will be scheduled for outpatient sleep study --Consider outpatient referral for bariatric surgery    Labs   CBC: Recent Labs  Lab 07/26/21 1414 07/26/21 1549 07/27/21 0400  WBC 6.4  --  6.7  NEUTROABS 4.8  --   --   HGB 12.2* 15.3 11.4*  HCT 40.7 45.0 37.7*  MCV 89.8  --  90.2  PLT 203  --  178    Basic Metabolic Panel: Recent Labs  Lab 07/26/21 1414 07/26/21 1549 07/28/21 0243  NA 137 141 138  K 4.2 4.0 3.9  CL 101  --  98  CO2 28  --  34*  GLUCOSE 105*  --  113*  BUN 16  --  14  CREATININE 1.19  --  1.25*  CALCIUM 8.1*  --  8.0*   GFR: Estimated Creatinine Clearance: 161.6 mL/min (A) (by C-G formula based on SCr of 1.25 mg/dL (H)). Recent Labs  Lab 07/26/21 1414 07/27/21 0400  WBC 6.4 6.7    Liver Function Tests: Recent  Labs  Lab 07/26/21 1414  AST 20  ALT 14  ALKPHOS 96  BILITOT 1.1  PROT 6.8  ALBUMIN 2.9*   No results for input(s): LIPASE, AMYLASE in the last 168 hours. No results for input(s): AMMONIA in the last 168 hours.  ABG    Component Value Date/Time   PHART 7.390 07/26/2021 1549   PCO2ART 54.7 (H) 07/26/2021 1549   PO2ART 92 07/26/2021 1549   HCO3 33.2 (H) 07/26/2021 1549   TCO2 35 (H) 07/26/2021 1549   O2SAT 97.0 07/26/2021 1549     Coagulation Profile: Recent Labs  Lab 07/26/21 1600  INR 1.3*    Cardiac Enzymes: No results for input(s): CKTOTAL, CKMB, CKMBINDEX, TROPONINI in the last 168 hours.  HbA1C: No results found for: HGBA1C  CBG: No results for  input(s): GLUCAP in the last 168 hours.

## 2021-07-28 NOTE — Progress Notes (Signed)
PROGRESS NOTE    Hunter King  ZOX:[id number] DOB: 1966/06/11 DOA: 07/26/2021 PCP: Avis Epley, PA-C   Brief Narrative: 55 year old with past medical history significant for anxiety, depression, lymphedema, morbid obesity presents after having a fall at home.  Patient fell at home and passed out.  That time patient [facility] they check x-ray of his knee and he was discharged home. He presented to the ED [facility] again for further evaluation of syncopal episode. Patient was also noted to be hypoxic down to 87 on room air.  Chest x-ray showed cardiomegaly.  CT angio negative for PE, showed mild diffuse groundglass opacities suggest atelectasis or mild edema.    Assessment & Plan:   Principal Problem:   Acute respiratory failure with hypoxia and hypercapnia (HCC) Active Problems:   Syncope and collapse   Bradycardia   Cardiomegaly   Morbid obesity with BMI of 70 and over, adult (HCC)   Lymphedema   Debility   Anxiety and depression   Syncope  1-Acute hypoxic hypercapnic respiratory failure in the setting of obesity hypoventilation syndrome, acute on chronic diastolic heart failure exacerbation: -Continue with IV Lasix. -He will need BiPAP at discharge. -He needs to use BiPAP at bedtime while in the hospital. -Decrease Oxygen to 3 L.  -Negative 7 L.  -Monitor renal function.   2-Syncope: Likely Vasovagal.  CTA negative for PE. ECHO no aortic valve stenosis.   Lymphedema, chronic: Continue with wound care  Elevated D dimer;  CT negative for PE>  [place] negative for DVT.   Anxiety, Depression:  Continue with Zoloft and Mirtazapine.   Morbid obesity  BMI 83.  Needs lives style modification.      Estimated body mass index is 83.46 kg/m as calculated from the following:   Height as of 07/25/21:  (1.88 m).   Weight as of 07/25/21: 294.8 kg.   DVT prophylaxis: Heparin Code Status: Full code Family Communication: Family at  bedside Disposition Plan:  Status is: Inpatient  Remains inpatient appropriate because:IV treatments appropriate due to intensity of illness or inability to take PO  Dispo: The patient is from: Home              Anticipated d/c is to:  CIR versus rehab              Patient currently is not medically stable to d/c.   Difficult to place patient No        Consultants:  Pulmonologist  Procedures:  Echo: Grade 2 diastolic dysfunction  Antimicrobials:    Subjective: He is doing okay, he did not use BiPAP last night because the respiratory therapist thought that he did not need it.  He wants to lose weight and is willing to work on lifestyle modification Objective: Vitals:   07/27/21 1445 07/27/21 1500 07/27/21 1531 07/27/21 2055  BP:  113/61 130/73 140/61  Pulse: 66 62 62 64  Resp: (!) Temp:   97.9 F (36.6 C) 98.4 F (36.9 C)  TempSrc:      SpO2: 92% 93% 99% 96%    Intake/Output Summary (Last 24 hours) at 07/28/2021 0858 Last data filed at 07/28/2021 0321 Gross per 24 hour  Intake 755.24 ml  Output 7175 ml  Net -6419.76 ml   There were no vitals filed for this visit.  Examination:  General exam: Appears calm and comfortable  Respiratory system: Decrease breath sound Cardiovascular system: S1 & S2 heard, RRR.  Gastrointestinal system: Abdomen is nondistended, soft and nontender. No organomegaly or masses felt. Normal bowel sounds heard. Central nervous system: Alert and oriented. Extremities: BL LE with dressing    Data Reviewed: I have personally reviewed following labs and imaging studies  CBC: Recent Labs  Lab 07/26/21 1414 07/26/21 1549 07/27/21 0400  WBC 6.4  --  6.7  NEUTROABS 4.8  --   --   HGB 12.2* 15.3 11.4*  HCT 40.7 45.0 37.7*  MCV 89.8  --  90.2  PLT 203  --  178   Basic Metabolic Panel: Recent Labs  Lab 07/26/21 1414 07/26/21 1549 07/28/21 0243  NA 137 141 138  K 4.2 4.0 3.9  CL 101  --  98  CO2 28  --  34*   GLUCOSE 105*  --  113*  BUN 16  --  14  CREATININE 1.19  --  1.25*  CALCIUM 8.1*  --  8.0*   GFR: Estimated Creatinine Clearance: 161.6 mL/min (A) (by C-G formula based on SCr of 1.25 mg/dL (H)). Liver Function Tests: Recent Labs  Lab 07/26/21 1414  AST 20  ALT 14  ALKPHOS 96  BILITOT 1.1  PROT 6.8  ALBUMIN 2.9*   No results for input(s): LIPASE, AMYLASE in the last 168 hours. No results for input(s): AMMONIA in the last 168 hours. Coagulation Profile: Recent Labs  Lab 07/26/21 1600  INR 1.3*   Cardiac Enzymes: No results for input(s): CKTOTAL, CKMB, CKMBINDEX, TROPONINI in the last 168 hours. BNP (last 3 results) No results for input(s): PROBNP in the last 8760 hours. HbA1C: No results for input(s): HGBA1C in the last 72 hours. CBG: No results for input(s): GLUCAP in the last 168 hours. Lipid Profile: No results for input(s): CHOL, HDL, LDLCALC, TRIG, CHOLHDL, LDLDIRECT in the last 72 hours. Thyroid Function Tests: Recent Labs    07/26/21 1955  TSH 2.622   Anemia Panel: No results for input(s): VITAMINB12, FOLATE, FERRITIN, TIBC, IRON, RETICCTPCT in the last 72 hours. Sepsis Labs: No results for input(s): PROCALCITON, LATICACIDVEN in the last 168 hours.  Recent Results (from the past 240 hour(s))  Resp Panel by RT-PCR (Flu A&B, Covid) Nasopharyngeal Swab     Status: None   Collection Time: 07/26/21  3:28 PM   Specimen: Nasopharyngeal Swab; Nasopharyngeal(NP) swabs in vial transport medium  Result Value Ref Range Status   SARS Coronavirus 2 by RT PCR NEGATIVE NEGATIVE Final    Comment: (NOTE) SARS-CoV-2 target nucleic acids are NOT DETECTED.  The SARS-CoV-2 RNA is generally detectable in upper respiratory specimens during the acute phase of infection. The lowest concentration of SARS-CoV-2 viral copies this assay can detect is 138 copies/mL. A negative result does not preclude SARS-Cov-2 infection and should not be used as the sole basis for treatment  or other patient management decisions. A negative result may occur with  improper specimen collection/handling, submission of specimen other than nasopharyngeal swab, presence of viral mutation(s) within the areas targeted by this assay, and inadequate number of viral copies(<138 copies/mL). A negative result must be combined with clinical observations, patient history, and epidemiological information. The expected result is Negative.  Fact Sheet for Patients:  BloggerCourse.com  Fact Sheet for Healthcare Providers:  SeriousBroker.it  This test is no t yet approved or cleared by the [place] FDA and  has been authorized for detection and/or diagnosis of SARS-CoV-2 by FDA under an Emergency Use Authorization (EUA). This EUA will remain  in effect (meaning this test can be  used) for the duration of the COVID-19 declaration under Section 564(b)(1) of the Act, 21 U.S.C.section 360bbb-3(b)(1), unless the authorization is terminated  or revoked sooner.       Influenza A by PCR NEGATIVE NEGATIVE Final   Influenza B by PCR NEGATIVE NEGATIVE Final    Comment: (NOTE) The Xpert Xpress SARS-CoV-2/FLU/RSV plus assay is intended as an aid in the diagnosis of influenza from Nasopharyngeal swab specimens and should not be used as a sole basis for treatment. Nasal washings and aspirates are unacceptable for Xpert Xpress SARS-CoV-2/FLU/RSV testing.  Fact Sheet for Patients: BloggerCourse.com  Fact Sheet for Healthcare Providers: SeriousBroker.it  This test is not yet approved or cleared by the [place] FDA and has been authorized for detection and/or diagnosis of SARS-CoV-2 by FDA under an Emergency Use Authorization (EUA). This EUA will remain in effect (meaning this test can be used) for the duration of the COVID-19 declaration under Section 564(b)(1) of the Act, 21 U.S.C. section  360bbb-3(b)(1), unless the authorization is terminated or revoked.  Performed at [facility], [address] [place], [place] [address]          Radiology Studies: DG Chest 1 View  Result Date: 07/26/2021 CLINICAL DATA:  Dyspnea.  Leg swelling. EXAM: CHEST  1 VIEW COMPARISON:  None. FINDINGS: Cardiac enlargement. No pleural effusion or edema. No airspace opacities identified. The visualized osseous structures are unremarkable. IMPRESSION: 1. Cardiac enlargement. 2. No acute findings. Electronically Signed   By: Signa Kell M.D.   On: 07/26/2021 14:52   CT Angio Chest PE W and/or Wo Contrast  Result Date: 07/26/2021 CLINICAL DATA:  Concern for pulmonary embolism.  Short of breath. EXAM: CT ANGIOGRAPHY CHEST WITH CONTRAST TECHNIQUE: Multidetector CT imaging of the chest was performed using the standard protocol during bolus administration of intravenous contrast. Multiplanar CT image reconstructions and MIPs were obtained to evaluate the vascular anatomy. CONTRAST:  OMNIPAQUE IOHEXOL 350 MG/ML SOLN COMPARISON:  None. FINDINGS: Cardiovascular: No filling defects within the pulmonary arteries to suggest acute pulmonary embolism. Mild moderate image degradation related to body habitus and respiratory motion. Mediastinum/Nodes: No axillary or supraclavicular adenopathy. 15 mm lymph node adjacent to the bronchus intermedius (image 48/5) no pericardial fluid. Esophagus normal. Lungs/Pleura: No pulmonary infarction. No pneumonia. Ground-glass opacities in the upper lobes in a mosaic pattern. No pleural fluid. No pneumothorax. Upper Abdomen: Limited view of the liver, kidneys, pancreas are unremarkable. Normal adrenal glands. Musculoskeletal: No aggressive osseous lesion. Degenerative osteophytosis of the spine. Review of the MIP images confirms the above findings. IMPRESSION: 1. No evidence acute pulmonary embolism. Mild-to-moderate image degradation as above. 2. Mild diffuse ground-glass  opacities suggest atelectasis or mild edema. 3. No pleural fluid or pneumonia. 4. Single subcarinal node is favored reactive. Electronically Signed   By: Genevive Bi M.D.   On: 07/26/2021 18:14   ECHOCARDIOGRAM COMPLETE  Result Date: 07/27/2021    ECHOCARDIOGRAM REPORT   Patient Name:   ALEXI GEIBEL [facility] Date of Exam: 07/27/2021 Medical Rec #:  [id number]        Height:       74.0 in Accession #:    [id number]       Weight:       650.0 lb Date of Birth:  1966/07/06        BSA:          3.587 m Patient Age:    55 years         BP:  137/63 mmHg Patient Gender: M                HR:           75 bpm. Exam Location:  Inpatient Procedure: 2D Echo, Cardiac Doppler, Color Doppler and Intracardiac            Opacification Agent Indications:    I51.7 Cardiomegaly  History:        Patient has no prior history of Echocardiogram examinations.                 Risk Factors:Hypertension.  Sonographer:    Elmarie Shileyiffany Dance Referring Phys: [id number] RONDELL A SMITH  Sonographer Comments: Patient is morbidly obese. IMPRESSIONS  1. Left ventricular ejection fraction, by estimation, is 65 to 70%. The left ventricle has normal function. The left ventricle has no regional wall motion abnormalities. There is moderate left ventricular hypertrophy. Left ventricular diastolic parameters are consistent with Grade II diastolic dysfunction (pseudonormalization). Elevated left ventricular end-diastolic pressure. The E/e' is 15.  2. Right ventricular systolic function is normal. The right ventricular size is mildly enlarged. There is mildly elevated pulmonary artery systolic pressure. The estimated right ventricular systolic pressure is 44.7 mmHg.  3. The mitral valve is grossly normal. No evidence of mitral valve regurgitation.  4. The aortic valve is tricuspid. Aortic valve regurgitation is not visualized.  5. The inferior vena cava is dilated in size with >50% respiratory variability, suggesting right atrial pressure of 8 mmHg.  Comparison(s): No prior Echocardiogram. FINDINGS  Left Ventricle: Left ventricular ejection fraction, by estimation, is 65 to 70%. The left ventricle has normal function. The left ventricle has no regional wall motion abnormalities. Definity contrast agent was given IV to delineate the left ventricular  endocardial borders. The left ventricular internal cavity size was normal in size. There is moderate left ventricular hypertrophy. Left ventricular diastolic parameters are consistent with Grade II diastolic dysfunction (pseudonormalization). Elevated left ventricular end-diastolic pressure. The E/e' is 15. Right Ventricle: The right ventricular size is mildly enlarged. No increase in right ventricular wall thickness. Right ventricular systolic function is normal. There is mildly elevated pulmonary artery systolic pressure. The tricuspid regurgitant velocity is 3.03 m/s, and with an assumed right atrial pressure of 8 mmHg, the estimated right ventricular systolic pressure is 44.7 mmHg. Left Atrium: Left atrial size was normal in size. Right Atrium: Right atrial size was normal in size. Pericardium: There is no evidence of pericardial effusion. Mitral Valve: The mitral valve is grossly normal. No evidence of mitral valve regurgitation. Tricuspid Valve: The tricuspid valve is grossly normal. Tricuspid valve regurgitation is trivial. Aortic Valve: The aortic valve is tricuspid. Aortic valve regurgitation is not visualized. Pulmonic Valve: The pulmonic valve was normal in structure. Pulmonic valve regurgitation is not visualized. Aorta: The aortic root and ascending aorta are structurally normal, with no evidence of dilitation. Venous: The inferior vena cava is dilated in size with greater than 50% respiratory variability, suggesting right atrial pressure of 8 mmHg. IAS/Shunts: No atrial level shunt detected by color flow Doppler.  LEFT VENTRICLE PLAX 2D LVIDd:         5.60 cm  Diastology LVIDs:         3.50 cm  LV e'  medial:    8.59 cm/s LV PW:         1.30 cm  LV E/e' medial:  15.5 LV IVS:        1.50 cm  LV e' lateral:   9.25 cm/s  LVOT diam:     2.10 cm  LV E/e' lateral: 14.4 LV SV:         97 LV SV Index:   27 LVOT Area:     3.46 cm  RIGHT VENTRICLE             IVC RV Basal diam:  4.30 cm     IVC diam: 3.60 cm RV Mid diam:    3.40 cm RV S prime:     14.10 cm/s TAPSE (M-mode): 2.5 cm LEFT ATRIUM              Index       RIGHT ATRIUM           Index LA diam:        4.30 cm  1.20 cm/m  RA Area:     22.70 cm LA Vol (A2C):   109.0 ml 30.39 ml/m RA Volume:   77.00 ml  21.47 ml/m LA Vol (A4C):   57.9 ml  16.14 ml/m LA Biplane Vol: 79.4 ml  22.14 ml/m  AORTIC VALVE             PULMONIC VALVE LVOT Vmax:   133.00 cm/s PR End Diast Vel: 5.57 msec LVOT Vmean:  98.300 cm/s LVOT VTI:    0.279 m  AORTA Ao Root diam: 3.30 cm Ao Asc diam:  3.70 cm MITRAL VALVE                TRICUSPID VALVE MV Area (PHT): 2.76 cm     TR Peak grad:   36.7 mmHg MV Decel Time: 275 msec     TR Vmax:        303.00 cm/s MV E velocity: 133.00 cm/s MV A velocity: 88.70 cm/s   SHUNTS MV E/A ratio:  1.50         Systemic VTI:  0.28 m                             Systemic Diam: 2.10 cm Zoila Shutter MD Electronically signed by Zoila Shutter MD Signature Date/Time: 07/27/2021/1:29:01 PM    Final    VAS [place] LOWER EXTREMITY VENOUS (DVT)  Result Date: 07/27/2021  Lower Venous DVT Study Patient Name:  AL BRACEWELL [facility]  Date of Exam:   07/27/2021 Medical Rec #: [id number]         Accession #:    [id number] Date of Birth: 1966/03/13         Patient Gender: M Patient Age:   77 years Exam Location:  [facility] Procedure:      VAS [place] LOWER EXTREMITY VENOUS (DVT) Referring Phys: Madelyn Flavors --------------------------------------------------------------------------------  Indications: SOB, elevated d-dimer.  Limitations: Body habitus, morbid obesity, and poor ultrasound/tissue interface due to skin changes. Comparison Study: 07-26-2021 CTA chest was negative for  PE. Performing Technologist: Jean Rosenthal RDMS,RVT  Examination Guidelines: A complete evaluation includes B-mode imaging, spectral Doppler, color Doppler, and power Doppler as needed of all accessible portions of each vessel. Bilateral testing is considered an integral part of a complete examination. Limited examinations for reoccurring indications may be performed as noted. The reflux portion of the exam is performed with the patient in reverse Trendelenburg.  +---------+---------------+---------+-----------+----------+--------------+ RIGHT    CompressibilityPhasicitySpontaneityPropertiesThrombus Aging +---------+---------------+---------+-----------+----------+--------------+ CFV      Full           Yes      Yes                                 +---------+---------------+---------+-----------+----------+--------------+  SFJ      Full                                                        +---------+---------------+---------+-----------+----------+--------------+ FV Prox  Full                                                        +---------+---------------+---------+-----------+----------+--------------+ FV Mid                  Yes      Yes                                 +---------+---------------+---------+-----------+----------+--------------+ FV Distal               Yes      Yes                                 +---------+---------------+---------+-----------+----------+--------------+ PFV      Full                                                        +---------+---------------+---------+-----------+----------+--------------+ POP                     Yes      Yes                                 +---------+---------------+---------+-----------+----------+--------------+ PTV                     Yes      Yes                                 +---------+---------------+---------+-----------+----------+--------------+ PERO                                                   Not visualized +---------+---------------+---------+-----------+----------+--------------+   +---------+---------------+---------+-----------+----------+--------------+ LEFT     CompressibilityPhasicitySpontaneityPropertiesThrombus Aging +---------+---------------+---------+-----------+----------+--------------+ CFV      Full           Yes      Yes                                 +---------+---------------+---------+-----------+----------+--------------+ SFJ      Full                                                        +---------+---------------+---------+-----------+----------+--------------+  FV Prox  Full                                                        +---------+---------------+---------+-----------+----------+--------------+ FV Mid                  Yes      Yes                                 +---------+---------------+---------+-----------+----------+--------------+ FV Distal               Yes      Yes                                 +---------+---------------+---------+-----------+----------+--------------+ PFV      Full                                                        +---------+---------------+---------+-----------+----------+--------------+ POP      Full           Yes      Yes                                 +---------+---------------+---------+-----------+----------+--------------+ PTV                     Yes      Yes                                 +---------+---------------+---------+-----------+----------+--------------+ PERO                    Yes      Yes                                 +---------+---------------+---------+-----------+----------+--------------+     Summary: RIGHT: - There is no evidence of deep vein thrombosis in the lower extremity. However, portions of this examination were limited- see technologist comments above.  - No cystic structure found in the popliteal fossa.  LEFT: -  There is no evidence of deep vein thrombosis in the lower extremity. However, portions of this examination were limited- see technologist comments above.  - No cystic structure found in the popliteal fossa.  *See table(s) above for measurements and observations. Electronically signed by Sherald Hess MD on 07/27/2021 at 2:08:29 PM.    Final         Scheduled Meds:  furosemide  40 mg Intravenous BID   heparin injection (subcutaneous)  5,000 Units Subcutaneous Q8H   mirtazapine  15 mg Oral QHS   sertraline  150 mg Oral Daily   sodium chloride flush  3 mL Intravenous Q12H   Continuous Infusions:   LOS: 1 day    Time spent: 35 minutes    Korben Carcione A Kathy Wares, MD [facility]   If 7PM-7AM, please contact  night-coverage [email or web address]  07/28/2021, 8:58 AM

## 2021-07-28 NOTE — Plan of Care (Signed)

## 2021-07-28 NOTE — Progress Notes (Signed)
Nutrition Brief Note  RD consulted for diet education regarding "obesity."   Admitting Dx: Syncope [R55] Dyspnea [R06.00] Acute respiratory failure with hypoxia (HCC) [J96.01] PMH:  Past Medical History:  Diagnosis Date   Arthritis    Diabetes mellitus without complication (HCC)    Obesities, morbid (HCC)    Medications:   furosemide  40 mg Intravenous BID   heparin injection (subcutaneous)  5,000 Units Subcutaneous Q8H   mirtazapine  15 mg Oral QHS   sertraline  150 mg Oral Daily   sodium chloride flush  3 mL Intravenous Q12H   Labs: Recent Labs  Lab 07/26/21 1414 07/26/21 1549 07/28/21 0243  NA 137 141 138  K 4.2 4.0 3.9  CL 101  --  98  CO2 28  --  34*  BUN 16  --  14  CREATININE 1.19  --  1.25*  CALCIUM 8.1*  --  8.0*  GLUCOSE 105*  --  113*    Wt Readings from Last 15 Encounters:  07/25/21 (!) 294.8 kg  There is no height or weight on file to calculate BMI. Current diet order is heart healthy. Pt denies changes to appetite/weight.   Obesity is a complex, chronic medical condition that is optimally managed by a multidisciplinary care team. Weight loss is not an ideal goal for an acute inpatient hospitalization. However, if further work-up for obesity is warranted, consider outpatient referral to outpatient bariatric service and/or [place] Diabetes [facility]. No nutrition interventions warranted at this time. If nutrition issues arise, please consult RD.    Eugene Gavia, MS, RD, LDN (she/her/hers) RD pager number and weekend/on-call pager number located in Amion.

## 2021-07-28 NOTE — Telephone Encounter (Signed)
Order placed and made note to [facility] to have them schedule after wk on 07/31/21

## 2021-07-28 NOTE — Progress Notes (Signed)
OT Note  Hunter King ordered this am.Feel be will be beneficial for early mobilization given morbid obesity and recent L knee injury with fall. [name], OT/L   Acute OT Clinical Specialist Acute Rehabilitation Services Pager [phone] Office [phone]

## 2021-07-28 NOTE — Progress Notes (Signed)
Physical Therapy Treatment Patient Details Name: Hunter King MRN: [id number] DOB: [birth year] Today's Date: 07/28/2021    History of Present Illness Pt is a 55yo male who presents to [facility] ED on 8/10 secondary to recent syncope on 8/9. Of note, was seen at [facility] ED 8/9 and evaluated for left knee pain but pt and family did not feel that syncopal episode was appropriately evaluated. Noted to be hypoxic. CTA of Chest showed mild diffuse ground-glass opacities suggestive of atelectasis or mild  edema. PMH: chronic lymphodema, DM, Morbid obesity, R knee surgery, bradycardia, anxiety, depression.    PT Comments    Pt initially anxious about mobility, but responds well to mod PT/OT verbal encouragement. Pt requiring min-max +3 assist for bed mobility and transfer to stand position, pt tolerating stand x1 minute before fatiguing and before becoming dyspneic. Pt motivated to progress mobility, continuing to recommend CIR.     Follow Up Recommendations  Other (comment);CIR     Equipment Recommendations  Other (comment);Hospital bed (Bariatric walker, bariatric BSC)    Recommendations for Other Services       Precautions / Restrictions Precautions Precautions: Fall Precaution Comments: OCD; agoraphobic; skin breakdown Restrictions Weight Bearing Restrictions: No    Mobility  Bed Mobility Overal bed mobility: Needs Assistance Bed Mobility: Supine to Sit;Sit to Supine     Supine to sit: Min assist;+2 for physical assistance Sit to supine: Max assist;+2 for physical assistance;HOB elevated (+3)   General bed mobility comments: min assist +2 for trunk elevation off of bed posteriorly, light LE assist. Pt using bedrails to pull trunk forward. Max +3 assist for return to supine for LE lifting, truncal lowering and boost once supine.    Transfers Overall transfer level: Needs assistance Equipment used: Ambulation equipment used Transfers: Sit to/from Stand Sit to Stand: Mod  assist;+2 physical assistance         General transfer comment: mod +2 for power up, rise, steadying. Pt sustained stand x1 minute before needing to sit  Ambulation/Gait                 Stairs             Wheelchair Mobility    Modified Rankin (Stroke Patients Only)       Balance Overall balance assessment: Needs assistance Sitting-balance support: Single extremity supported;Feet supported Sitting balance-Leahy Scale: Fair Sitting balance - Comments: able to sit EOB without PT or OT support   Standing balance support: Bilateral upper extremity supported;During functional activity Standing balance-Leahy Scale: Poor Standing balance comment: reliant on external assist, no evidence of LE buckling                            Cognition Arousal/Alertness: Awake/alert Behavior During Therapy: Anxious Overall Cognitive Status: Within Functional Limits for tasks assessed                                 General Comments: anxious with mobility, benefits from therapist verbal encouragement      Exercises General Exercises - Lower Extremity Ankle Circles/Pumps: AROM;Both;5 reps;Supine Heel Slides: AROM;Both;5 reps;Supine Hip Flexion/Marching: AROM;Both;5 reps;Seated    General Comments General comments (skin integrity, edema, etc.): DOE 3/4 with activity, O2 donned via [place], cues for breathing technique and rest breaks as needed      Pertinent Vitals/Pain Pain Assessment: Faces Faces Pain Scale: Hurts little more Pain  Location: LEs Pain Descriptors / Indicators: Grimacing;Guarding Pain Intervention(s): Limited activity within patient's tolerance;Monitored during session;Repositioned    Home Living                      Prior Function            PT Goals (current goals can now be found in the care plan section) Acute Rehab PT Goals Patient Stated Goal: to determine why he fell PT Goal Formulation: With patient Time For  Goal Achievement: 08/10/21 Potential to Achieve Goals: Fair Progress towards PT goals: Progressing toward goals    Frequency    Min 2X/week      PT Plan Current plan remains appropriate    Co-evaluation PT/OT/SLP Co-Evaluation/Treatment: Yes Reason for Co-Treatment: For patient/therapist safety;To address functional/ADL transfers          AM-PAC PT "6 Clicks" Mobility   Outcome Measure  Help needed turning from your back to your side while in a flat bed without using bedrails?: A Little Help needed moving from lying on your back to sitting on the side of a flat bed without using bedrails?: A Lot Help needed moving to and from a bed to a chair (including a wheelchair)?: Total Help needed standing up from a chair using your arms (e.g., wheelchair or bedside chair)?: A Lot Help needed to walk in hospital room?: Total Help needed climbing 3-5 steps with a railing? : Total 6 Click Score: 10    End of Session Equipment Utilized During Treatment: Oxygen (2L via [place]) Activity Tolerance: Patient limited by fatigue;Patient limited by pain Patient left: in bed;with call bell/phone within reach (on bariatric bed in ED) Nurse Communication: Mobility status PT Visit Diagnosis: History of falling (Z91.81);Muscle weakness (generalized) (M62.81);Difficulty in walking, not elsewhere classified (R26.2)     Time: 8315-1761 PT Time Calculation (min) (ACUTE ONLY): 48 min  Charges:  $Therapeutic Activity: 8-22 mins                    Marye Round, PT DPT Acute Rehabilitation Services Pager [phone]  Office [phone]    Hunter King 07/28/2021, 5:14 PM

## 2021-07-28 NOTE — Progress Notes (Signed)
Pt. Refused cpap. 

## 2021-07-28 NOTE — Progress Notes (Signed)
Occupational Therapy Treatment Patient Details Name: Hunter King MRN: [id number] DOB: 1965-12-19 Today's Date: 07/28/2021    History of present illness Pt is a 55yo male who presents to [facility] ED on 8/10 secondary to recent syncope on 8/9. Of note, was seen at [facility] ED 8/9 and evaluated for left knee pain but pt and family did not feel that syncopal episode was appropriately evaluated. Noted to be hypoxic. CTA of Chest showed mild diffuse ground-glass opacities suggestive of atelectasis or mild  edema. PMH: chronic lymphodema, DM, Morbid obesity, R knee surgery, bradycardia, anxiety, depression.   OT comments  OT calling tilt bed company and reports bed pending arrival at 1500pm without bed arrival at this time. Sling for maxisky and slide sheet placed in new room to (A) nursing with patient care. Pt progressed from supine to standing eob this session. Pt holding breath and needs ~3 minutes to rebound. Recommendation CIR and dietitian for diet changes ( pt requesting help).   Follow Up Recommendations  CIR    Equipment Recommendations  Other (comment)    Recommendations for Other Services Rehab consult    Precautions / Restrictions Precautions Precautions: Fall Precaution Comments: OCD; agoraphobic; skin breakdown Restrictions Weight Bearing Restrictions: No       Mobility Bed Mobility Overal bed mobility: Needs Assistance Bed Mobility: Supine to Sit;Sit to Supine     Supine to sit: Max assist;+2 for physical assistance Sit to supine: Max assist;+2 for physical assistance (+3)   General bed mobility comments: Pt required max Assist of +3 for supine to sit for trunk control and scooting to edge of bed. Pt with posterior lean in sitting and required at least mod A to maintain sitting balance. Pt required max assist of +3 for sit to supine for trunk control and LE advancement into bed. Pt was able to use BUE and bridging to scoot up in bed while bed was in declined position.     Transfers Overall transfer level: Needs assistance Equipment used: Ambulation equipment used Transfers: Sit to/from Stand Sit to Stand: +2 physical assistance;Mod assist;From elevated surface         General transfer comment: chair turned backward to allow pt to power up from bed with chair handles and static stand over back of chair    Balance Overall balance assessment: Needs assistance Sitting-balance support: Bilateral upper extremity supported;Feet supported Sitting balance-Leahy Scale: Fair Sitting balance - Comments: able to sit EOB without PT or OT support   Standing balance support: Bilateral upper extremity supported;During functional activity Standing balance-Leahy Scale: Poor Standing balance comment: reliant on external assist, no evidence of LE buckling                           ADL either performed or assessed with clinical judgement   ADL Overall ADL's : Needs assistance/impaired Eating/Feeding: Modified independent   Grooming: Modified independent               Lower Body Dressing: Maximal assistance Lower Body Dressing Details (indicate cue type and reason): pt had boxers on front but needed help with posterior aspect               General ADL Comments: pt progressed from supine to standing at Eob during session. pt with DOE 3 out 4     Vision       Perception     Praxis      Cognition Arousal/Alertness: Awake/alert Behavior  During Therapy: Anxious Overall Cognitive Status: Within Functional Limits for tasks assessed                                 General Comments: anxious with mobility, benefits from therapist verbal encouragement        Exercises General Exercises - Lower Extremity Ankle Circles/Pumps: AROM;Both;5 reps;Supine Heel Slides: AROM;Both;5 reps;Supine Hip Flexion/Marching: AROM;Both;5 reps;Seated   Shoulder Instructions       General Comments risk for skin break down. needs pillow  cases underskin folders for skin integrity    Pertinent Vitals/ Pain       Pain Assessment: Faces Faces Pain Scale: Hurts little more Pain Location: LEs Pain Descriptors / Indicators: Grimacing;Guarding Pain Intervention(s): Limited activity within patient's tolerance;Monitored during session;Repositioned  Home Living                                          Prior Functioning/Environment              Frequency  Min 2X/week        Progress Toward Goals  OT Goals(current goals can now be found in the care plan section)  Progress towards OT goals: Progressing toward goals  Acute Rehab OT Goals 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 seen previous data.) OT Goal Formulation: With patient Time For Goal Achievement: 08/10/21 Potential to Achieve Goals: Good ADL Goals Pt Will Perform Lower Body Bathing: with adaptive equipment;with supervision;with set-up Pt Will Perform Lower Body Dressing: with supervision;with set-up;with adaptive equipment;sit to/from stand Pt Will Transfer to Toilet: with modified independence;ambulating Pt Will Perform Toileting - Clothing Manipulation and hygiene: with modified independence;sit to/from stand;sitting/lateral leans;with adaptive equipment Pt/caregiver will Perform Home Exercise Program: Increased strength;Independently;With theraband;With written HEP provided Additional ADL Goal #1: Pt will verbalize 2 strateiges to manage MASD in skin folds and assist with care  Plan Discharge plan remains appropriate    Co-evaluation    PT/OT/SLP Co-Evaluation/Treatment: Yes Reason for Co-Treatment: For patient/therapist safety;To address functional/ADL transfers          AM-PAC OT "6 Clicks" Daily Activity     Outcome Measure   Help from another person eating meals?: None Help from another person taking care of personal grooming?: A Little Help from another person  toileting, which includes using toliet, bedpan, or urinal?: A Lot Help from another person bathing (including washing, rinsing, drying)?: A Lot Help from another person to put on and taking off regular upper body clothing?: A Lot Help from another person to put on and taking off regular lower body clothing?: A Lot 6 Click Score: 15    End of Session Equipment Utilized During Treatment: Oxygen  OT Visit Diagnosis: Other abnormalities of gait and mobility (R26.89);Muscle weakness (generalized) (M62.81);History of falling (Z91.81);Pain Pain - Right/Left: Left Pain - part of body: Shoulder;Knee   Activity Tolerance Patient tolerated treatment well   Patient Left in bed;with call bell/phone within reach   Nurse Communication Mobility status;Need for lift equipment;Precautions;Weight bearing status        Time: 7510-2585 OT Time Calculation (min): 48 min  Charges: OT General Charges $OT Visit: 1 Visit OT Treatments $Self Care/Home Management : 23-37 mins   Brynn, OTR/L  Acute Rehabilitation Services Pager: [phone] Office: [phone] .  Mateo Flow 07/28/2021, 5:18 PM

## 2021-07-29 DIAGNOSIS — J9602 Acute respiratory failure with hypercapnia: Secondary | ICD-10-CM | POA: Diagnosis not present

## 2021-07-29 DIAGNOSIS — J9601 Acute respiratory failure with hypoxia: Secondary | ICD-10-CM | POA: Diagnosis not present

## 2021-07-29 LAB — BASIC METABOLIC PANEL
Anion gap: 14 (ref 5–15)
BUN: 13 mg/dL (ref 6–20)
CO2: 28 mmol/L (ref 22–32)
Calcium: 8.2 mg/dL — ABNORMAL LOW (ref 8.9–10.3)
Chloride: 96 mmol/L — ABNORMAL LOW (ref 98–111)
Creatinine, Ser: 1.14 mg/dL (ref 0.61–1.24)
GFR, Estimated: >60 mL/min (ref >60)
Glucose, Bld: 105 mg/dL — ABNORMAL HIGH (ref 70–99)
Potassium: 4 mmol/L (ref 3.5–5.1)
Sodium: 138 mmol/L (ref 135–145)

## 2021-07-29 MED ORDER — IPRATROPIUM-ALBUTEROL 0.5-2.5 (3) MG/3ML IN SOLN
3.0000 mL | Freq: Four times a day (QID) | RESPIRATORY_TRACT | Status: DC
  Administered 2021-07-29 (×2): 3 mL via RESPIRATORY_TRACT
  Filled 2021-07-29 (×4): qty 3

## 2021-07-29 MED ORDER — IPRATROPIUM-ALBUTEROL 0.5-2.5 (3) MG/3ML IN SOLN
3.0000 mL | Freq: Three times a day (TID) | RESPIRATORY_TRACT | Status: DC
  Administered 2021-07-30 – 2021-08-01 (×9): 3 mL via RESPIRATORY_TRACT
  Filled 2021-07-29 (×9): qty 3

## 2021-07-29 MED ORDER — GUAIFENESIN ER 600 MG PO TB12
600.0000 mg | ORAL_TABLET | Freq: Two times a day (BID) | ORAL | Status: DC
  Administered 2021-07-29 – 2021-08-11 (×26): 600 mg via ORAL
  Filled 2021-07-29 (×27): qty 1

## 2021-07-29 NOTE — Progress Notes (Signed)
Occupational Therapy Treatment Patient Details Name: Hunter King MRN: [id number] DOB: Jul 08, 1966 Today's Date: 07/29/2021    History of present illness Pt is a 55yo male who presents to [facility] ED on 8/10 secondary to recent syncope on 8/9. Of note, was seen at [facility] ED 8/9 and evaluated for left knee pain but pt and family did not feel that syncopal episode was appropriately evaluated. Noted to be hypoxic. CTA of Chest showed mild diffuse ground-glass opacities suggestive of atelectasis or mild  edema. PMH: chronic lymphodema, DM, Morbid obesity, R knee surgery, bradycardia, anxiety, depression.   OT comments  Focus of session was repositioning pt on Tilt bed to increase mobility. Maxisky 1000 +3 people used to transfer onto new bed to reduce risk of injury to pt and staff. Once pt positioned in new bed, pt placed in chair position and educated on exercises to complete with use of bed to facilitate recovery. During bed mobility, pt with apparent desat into the 80s on RA with increased WOB. At rest, SpO2 in the 90s on RA. Pt very motivated to become more independent. Will follow acutely. At this time feel CIR remains appropriate.   Follow Up Recommendations  CIR    Equipment Recommendations  Other (comment)    Recommendations for Other Services Rehab consult    Precautions / Restrictions Precautions Precautions: Fall Precaution Comments: OCD; agoraphobic; skin breakdown       Mobility Bed Mobility Overal bed mobility: Needs Assistance Bed Mobility: Rolling Rolling: +2 for physical assistance;Min assist         General bed mobility comments: uses momentum toroll with heavy reliance on bed rails    Transfers                      Balance                                           ADL either performed or assessed with clinical judgement   ADL                                         General ADL Comments: encouraged pt to  complete as much of his ADL tasks as possible     Vision       Perception     Praxis      Cognition Arousal/Alertness: Awake/alert Behavior During Therapy: Anxious Overall Cognitive Status: Within Functional Limits for tasks assessed                                          Exercises General Exercises - Lower Extremity Ankle Circles/Pumps: AROM;Both;5 reps;Supine Heel Slides: AROM;Both;5 reps;Supine Hip Flexion/Marching: AROM;Both;5 reps;Seated   Shoulder Instructions       General Comments      Pertinent Vitals/ Pain       Pain Assessment: Faces Faces Pain Scale: Hurts little more Pain Location: LEs Pain Descriptors / Indicators: Grimacing;Guarding Pain Intervention(s): Limited activity within patient's tolerance  Home Living  Prior Functioning/Environment              Frequency  Min 2X/week        Progress Toward Goals  OT Goals(current goals can now be found in the care plan section)  Progress towards OT goals: Progressing toward goals  Acute Rehab OT Goals Patient Stated Goal: to do some rehab OT Goal Formulation: With patient Time For Goal Achievement: 08/10/21 Potential to Achieve Goals: Good ADL Goals Pt Will Perform Lower Body Bathing: with adaptive equipment;with supervision;with set-up Pt Will Perform Lower Body Dressing: with supervision;with set-up;with adaptive equipment;sit to/from stand Pt Will Transfer to Toilet: with modified independence;ambulating Pt Will Perform Toileting - Clothing Manipulation and hygiene: with modified independence;sit to/from stand;sitting/lateral leans;with adaptive equipment Pt/caregiver will Perform Home Exercise Program: Increased strength;Independently;With theraband;With written HEP provided Additional ADL Goal #1: Pt will verbalize 2 strateiges to manage MASD in skin folds and assist with care  Plan Discharge plan remains  appropriate    Co-evaluation                 AM-PAC OT "6 Clicks" Daily Activity     Outcome Measure   Help from another person eating meals?: None Help from another person taking care of personal grooming?: A Little Help from another person toileting, which includes using toliet, bedpan, or urinal?: A Lot Help from another person bathing (including washing, rinsing, drying)?: A Lot Help from another person to put on and taking off regular upper body clothing?: A Lot Help from another person to put on and taking off regular lower body clothing?: A Lot 6 Click Score: 15    End of Session Equipment Utilized During Treatment: Oxygen (2L)  OT Visit Diagnosis: Other abnormalities of gait and mobility (R26.89);Muscle weakness (generalized) (M62.81);History of falling (Z91.81);Pain Pain - Right/Left: Left Pain - part of body: Shoulder;Knee   Activity Tolerance Patient tolerated treatment well   Patient Left in bed;with call bell/phone within reach;with bed alarm set;with family/visitor present   Nurse Communication Mobility status;Other (comment) (use of bed)        Time: 4270-6237 OT Time Calculation (min): 61 min  Charges: OT Treatments $Self Care/Home Management : 38-52 mins $Therapeutic Exercise: 8-22 mins  [name], OT/L   Acute OT Clinical Specialist Acute Rehabilitation Services Pager [phone] Office [phone]    [facility] 07/29/2021, 1:57 PM

## 2021-07-29 NOTE — Progress Notes (Signed)
PROGRESS NOTE    Hunter King  SWN:[id number] DOB: 1966-07-09 DOA: 07/26/2021 PCP: Avis Epley, PA-C   Brief Narrative: 55 year old with past medical history significant for anxiety, depression, lymphedema, morbid obesity presents after having a fall at home.  Patient fell at home and passed out.  That time patient was evaluated at [facility] they check x-ray of his knee and he was discharged home. He presented to the ED [facility] again for further evaluation of syncopal episode. Patient was also noted to be hypoxic down to 87 on room air.  Chest x-ray showed cardiomegaly.  CT angio negative for PE, showed mild diffuse groundglass opacities suggest atelectasis or mild edema.    Assessment & Plan:   Principal Problem:   Acute respiratory failure with hypoxia and hypercapnia (HCC) Active Problems:   Syncope and collapse   Bradycardia   Cardiomegaly   Morbid obesity with BMI of 70 and over, adult (HCC)   Lymphedema   Debility   Anxiety and depression   Syncope  1-Acute hypoxic hypercapnic respiratory failure in the setting of obesity hypoventilation syndrome, acute on chronic diastolic heart failure exacerbation: -Continue with IV Lasix. -He will need BiPAP at discharge. -He needs to use BiPAP at bedtime while in the hospital. -Decrease Oxygen to 3 L. Wean as tolerated.  -Negative 11 L. Renal function stable.  -Monitor renal function.  -Add Guaifenesin and Nebulizer.   2-Syncope: Likely Vasovagal.  CTA negative for PE. ECHO no aortic valve stenosis.   Lymphedema, chronic: Continue with wound care  Elevated D dimer;  CT negative for PE>  [place] negative for DVT.   Anxiety, Depression:  Continue with Zoloft and Mirtazapine.   Morbid obesity  BMI 83.  Needs lives style modification.      Estimated body mass index is 83.44 kg/m as calculated from the following:   Height as of 07/25/21: 6\' 2"  (1.88 m).   Weight as of this encounter: 294.8  kg.   DVT prophylaxis: Heparin Code Status: Full code Family Communication: Family 8/12 Disposition Plan:  Status is: Inpatient  Remains inpatient appropriate because:IV treatments appropriate due to intensity of illness or inability to take PO  Dispo: The patient is from: Home              Anticipated d/c is to:  CIR versus rehab              Patient currently is not medically stable to d/c.   Difficult to place patient No        Consultants:  Pulmonologist  Procedures:  Echo: Grade 2 diastolic dysfunction  Antimicrobials:    Subjective: Respiratory Therapy didn't offer BIPAP last night.  He feels congested today.  Denies worsening dyspnea.   Objective: Vitals:   07/28/21 1423 07/28/21 2100 07/29/21 0355 07/29/21 0535  BP: (!) 127/58 110/68  (!) 147/63  Pulse: 61 65  64  Resp: 18 20  20   Temp: 98.5 F (36.9 C) 98.7 F (37.1 C)  97.7 F (36.5 C)  TempSrc:  Oral  Oral  SpO2: 94% 97%  91%  Weight:   (!) 294.8 kg     Intake/Output Summary (Last 24 hours) at 07/29/2021 1209 Last data filed at 07/29/2021 0500 Gross per 24 hour  Intake 600 ml  Output 2625 ml  Net -2025 ml    Filed Weights   07/29/21 0355  Weight: (!) 294.8 kg    Examination:  General exam: NAD Respiratory system: Decrease breath sound  Cardiovascular system: S 1, S 2 RRR Gastrointestinal system: BS present, soft, nt Central nervous system: Alert Extremities: BL LE with dressing    Data Reviewed: I have personally reviewed following labs and imaging studies  CBC: Recent Labs  Lab 07/26/21 1414 07/26/21 1549 07/27/21 0400  WBC 6.4  --  6.7  NEUTROABS 4.8  --   --   HGB 12.2* 15.3 11.4*  HCT 40.7 45.0 37.7*  MCV 89.8  --  90.2  PLT 203  --  178    Basic Metabolic Panel: Recent Labs  Lab 07/26/21 1414 07/26/21 1549 07/28/21 0243 07/29/21 0848  NA 137 141 138 138  K 4.2 4.0 3.9 4.0  CL 101  --  98 96*  CO2 28  --  34* 28  GLUCOSE 105*  --  113* 105*  BUN 16  --   14 13  CREATININE 1.19  --  1.25* 1.14  CALCIUM 8.1*  --  8.0* 8.2*    GFR: Estimated Creatinine Clearance: 177.2 mL/min (by C-G formula based on SCr of 1.14 mg/dL). Liver Function Tests: Recent Labs  Lab 07/26/21 1414  AST 20  ALT 14  ALKPHOS 96  BILITOT 1.1  PROT 6.8  ALBUMIN 2.9*    No results for input(s): LIPASE, AMYLASE in the last 168 hours. No results for input(s): AMMONIA in the last 168 hours. Coagulation Profile: Recent Labs  Lab 07/26/21 1600  INR 1.3*    Cardiac Enzymes: No results for input(s): CKTOTAL, CKMB, CKMBINDEX, TROPONINI in the last 168 hours. BNP (last 3 results) No results for input(s): PROBNP in the last 8760 hours. HbA1C: No results for input(s): HGBA1C in the last 72 hours. CBG: No results for input(s): GLUCAP in the last 168 hours. Lipid Profile: No results for input(s): CHOL, HDL, LDLCALC, TRIG, CHOLHDL, LDLDIRECT in the last 72 hours. Thyroid Function Tests: Recent Labs    07/26/21 1955  TSH 2.622    Anemia Panel: No results for input(s): VITAMINB12, FOLATE, FERRITIN, TIBC, IRON, RETICCTPCT in the last 72 hours. Sepsis Labs: No results for input(s): PROCALCITON, LATICACIDVEN in the last 168 hours.  Recent Results (from the past 240 hour(s))  Resp Panel by RT-PCR (Flu A&B, Covid) Nasopharyngeal Swab     Status: None   Collection Time: 07/26/21  3:28 PM   Specimen: Nasopharyngeal Swab; Nasopharyngeal(NP) swabs in vial transport medium  Result Value Ref Range Status   SARS Coronavirus 2 by RT PCR NEGATIVE NEGATIVE Final    Comment: (NOTE) SARS-CoV-2 target nucleic acids are NOT DETECTED.  The SARS-CoV-2 RNA is generally detectable in upper respiratory specimens during the acute phase of infection. The lowest concentration of SARS-CoV-2 viral copies this assay can detect is 138 copies/mL. A negative result does not preclude SARS-Cov-2 infection and should not be used as the sole basis for treatment or other patient management  decisions. A negative result may occur with  improper specimen collection/handling, submission of specimen other than nasopharyngeal swab, presence of viral mutation(s) within the areas targeted by this assay, and inadequate number of viral copies(<138 copies/mL). A negative result must be combined with clinical observations, patient history, and epidemiological information. The expected result is Negative.  Fact Sheet for Patients:  BloggerCourse.com  Fact Sheet for Healthcare Providers:  SeriousBroker.it  This test is no t yet approved or cleared by the [place] FDA and  has been authorized for detection and/or diagnosis of SARS-CoV-2 by FDA under an Emergency Use Authorization (EUA). This EUA will remain  in effect (meaning this test can be used) for the duration of the COVID-19 declaration under Section 564(b)(1) of the Act, 21 U.S.C.section 360bbb-3(b)(1), unless the authorization is terminated  or revoked sooner.       Influenza A by PCR NEGATIVE NEGATIVE Final   Influenza B by PCR NEGATIVE NEGATIVE Final    Comment: (NOTE) The Xpert Xpress SARS-CoV-2/FLU/RSV plus assay is intended as an aid in the diagnosis of influenza from Nasopharyngeal swab specimens and should not be used as a sole basis for treatment. Nasal washings and aspirates are unacceptable for Xpert Xpress SARS-CoV-2/FLU/RSV testing.  Fact Sheet for Patients: BloggerCourse.com  Fact Sheet for Healthcare Providers: SeriousBroker.it  This test is not yet approved or cleared by the [place] FDA and has been authorized for detection and/or diagnosis of SARS-CoV-2 by FDA under an Emergency Use Authorization (EUA). This EUA will remain in effect (meaning this test can be used) for the duration of the COVID-19 declaration under Section 564(b)(1) of the Act, 21 U.S.C. section 360bbb-3(b)(1), unless the  authorization is terminated or revoked.  Performed at [facility], [address], [place], [place] [address]           Radiology Studies: No results found.      Scheduled Meds:  furosemide  40 mg Intravenous BID   guaiFENesin  600 mg Oral BID   heparin injection (subcutaneous)  5,000 Units Subcutaneous Q8H   ipratropium-albuterol  3 mL Nebulization Q6H   mirtazapine  15 mg Oral QHS   sertraline  150 mg Oral Daily   sodium chloride flush  3 mL Intravenous Q12H   Continuous Infusions:   LOS: 2 days    Time spent: 35 minutes    Noel Rodier A Tonni Mansour, MD [facility]   If 7PM-7AM, please contact night-coverage [email or web address]  07/29/2021, 12:09 PM

## 2021-07-30 DIAGNOSIS — J9601 Acute respiratory failure with hypoxia: Secondary | ICD-10-CM | POA: Diagnosis not present

## 2021-07-30 DIAGNOSIS — J9602 Acute respiratory failure with hypercapnia: Secondary | ICD-10-CM | POA: Diagnosis not present

## 2021-07-30 LAB — CBC
HCT: 38.3 % — ABNORMAL LOW (ref 39.0–52.0)
Hemoglobin: 11.8 g/dL — ABNORMAL LOW (ref 13.0–17.0)
MCH: 27.4 pg (ref 26.0–34.0)
MCHC: 30.8 g/dL (ref 30.0–36.0)
MCV: 88.9 fL (ref 80.0–100.0)
Platelets: 187 10*3/uL (ref 150–400)
RBC: 4.31 MIL/uL (ref 4.22–5.81)
RDW: 16 % — ABNORMAL HIGH (ref 11.5–15.5)
WBC: 6.4 10*3/uL (ref 4.0–10.5)
nRBC: 0 % (ref 0.0–0.2)

## 2021-07-30 LAB — BASIC METABOLIC PANEL
Anion gap: 9 (ref 5–15)
BUN: 17 mg/dL (ref 6–20)
CO2: 33 mmol/L — ABNORMAL HIGH (ref 22–32)
Calcium: 8.4 mg/dL — ABNORMAL LOW (ref 8.9–10.3)
Chloride: 96 mmol/L — ABNORMAL LOW (ref 98–111)
Creatinine, Ser: 1.25 mg/dL — ABNORMAL HIGH (ref 0.61–1.24)
GFR, Estimated: >60 mL/min (ref >60)
Glucose, Bld: 113 mg/dL — ABNORMAL HIGH (ref 70–99)
Potassium: 3.8 mmol/L (ref 3.5–5.1)
Sodium: 138 mmol/L (ref 135–145)

## 2021-07-30 MED ORDER — POTASSIUM CHLORIDE CRYS ER 20 MEQ PO TBCR
20.0000 meq | EXTENDED_RELEASE_TABLET | Freq: Once | ORAL | Status: AC
  Administered 2021-07-30: 20 meq via ORAL
  Filled 2021-07-30: qty 1

## 2021-07-30 NOTE — Progress Notes (Addendum)
PROGRESS NOTE    Hunter King  WEX:[id number] DOB: [birth year] DOA: 07/26/2021 PCP: Avis Epley, PA-C   Brief Narrative: 55 year old with past medical history significant for anxiety, depression, lymphedema, morbid obesity presents after having a fall at home.  Patient fell at home and passed out.  That time patient was evaluated at [facility] they check x-ray of his knee and he was discharged home. He presented to the ED [facility] again for further evaluation of syncopal episode. Patient was also noted to be hypoxic down to 87 on room air.  Chest x-ray showed cardiomegaly.  CT angio negative for PE, showed mild diffuse groundglass opacities suggest atelectasis or mild edema.    Assessment & Plan:   Principal Problem:   Acute respiratory failure with hypoxia and hypercapnia (HCC) Active Problems:   Syncope and collapse   Bradycardia   Cardiomegaly   Morbid obesity with BMI of 70 and over, adult (HCC)   Lymphedema   Debility   Anxiety and depression   Syncope  1-Acute hypoxic hypercapnic respiratory failure in the setting of obesity hypoventilation syndrome, acute on chronic diastolic heart failure exacerbation: -Continue with IV Lasix. -He will need BiPAP at discharge. -BiPAP at bedtime while in the hospital. -On 3 L. Wean as tolerated.  -Negative 14 L. Renal function stable.  -Monitor renal function.  -Continue with  Guaifenesin and Nebulizer.  -Weight 649--604 weight change probably not accurate he was weight on different bed.   2-Syncope: Likely Vasovagal.  CTA negative for PE. ECHO no aortic valve stenosis.   Lymphedema, chronic: Continue with wound care  Elevated D dimer;  CT negative for PE>  [place] negative for DVT.   Anxiety, Depression:  Continue with Zoloft and Mirtazapine.   Morbid obesity  BMI 83.  Needs lives style modification.      Estimated body mass index is 77.67 kg/m as calculated from the following:   Height as of 07/25/21:  6\' 2"  (1.88 m).   Weight as of this encounter: 274.4 kg.   DVT prophylaxis: Heparin Code Status: Full code Family Communication: Family 8/12 Disposition Plan:  Status is: Inpatient  Remains inpatient appropriate because:IV treatments appropriate due to intensity of illness or inability to take PO  Dispo: The patient is from: Home              Anticipated d/c is to:  CIR versus SNF  rehab              Patient currently is medically stable to d/c. Awaiting CIR   Difficult to place patient No        Consultants:  Pulmonologist  Procedures:  Echo: Grade 2 diastolic dysfunction  Antimicrobials:    Subjective: He is feeling better from congestion.  He use to weight 535 two years ago, prior to Covid. He hasn't been able go to a doctors office since Covid started.  He is doing exercise with his leg in the bed.   Objective: Vitals:   07/29/21 2222 07/29/21 2301 07/30/21 0438 07/30/21 0742  BP: 120/60  134/72   Pulse: 63 (!) 58 (!) 58   Resp: 18 19 20    Temp: 98.5 F (36.9 C)  97.6 F (36.4 C)   TempSrc: Oral  Axillary   SpO2: 97% 97% 100% 97%  Weight:   (!) 274.4 kg     Intake/Output Summary (Last 24 hours) at 07/30/2021 1125 Last data filed at 07/30/2021 1121 Gross per 24 hour  Intake 360 ml  Output 3900 ml  Net -3540 ml    Filed Weights   07/29/21 0355 07/30/21 0438  Weight: (!) 294.8 kg (!) 274.4 kg    Examination:  General exam: NAD Respiratory system: BL air movement no wheezing.  Cardiovascular system: S 1, S 2 RRR Gastrointestinal system: BS present, soft, nt Central nervous system: Alert Extremities: BL LE with dressing.     Data Reviewed: I have personally reviewed following labs and imaging studies  CBC: Recent Labs  Lab 07/26/21 1414 07/26/21 1549 07/27/21 0400 07/30/21 0252  WBC 6.4  --  6.7 6.4  NEUTROABS 4.8  --   --   --   HGB 12.2* 15.3 11.4* 11.8*  HCT 40.7 45.0 37.7* 38.3*  MCV 89.8  --  90.2 88.9  PLT 203  --  178 187     Basic Metabolic Panel: Recent Labs  Lab 07/26/21 1414 07/26/21 1549 07/28/21 0243 07/29/21 0848 07/30/21 0252  NA 137 141 138 138 138  K 4.2 4.0 3.9 4.0 3.8  CL 101  --  98 96* 96*  CO2 28  --  34* 28 33*  GLUCOSE 105*  --  113* 105* 113*  BUN 16  --  14 13 17   CREATININE 1.19  --  1.25* 1.14 1.25*  CALCIUM 8.1*  --  8.0* 8.2* 8.4*    GFR: Estimated Creatinine Clearance: 153.8 mL/min (A) (by C-G formula based on SCr of 1.25 mg/dL (H)). Liver Function Tests: Recent Labs  Lab 07/26/21 1414  AST 20  ALT 14  ALKPHOS 96  BILITOT 1.1  PROT 6.8  ALBUMIN 2.9*    No results for input(s): LIPASE, AMYLASE in the last 168 hours. No results for input(s): AMMONIA in the last 168 hours. Coagulation Profile: Recent Labs  Lab 07/26/21 1600  INR 1.3*    Cardiac Enzymes: No results for input(s): CKTOTAL, CKMB, CKMBINDEX, TROPONINI in the last 168 hours. BNP (last 3 results) No results for input(s): PROBNP in the last 8760 hours. HbA1C: No results for input(s): HGBA1C in the last 72 hours. CBG: No results for input(s): GLUCAP in the last 168 hours. Lipid Profile: No results for input(s): CHOL, HDL, LDLCALC, TRIG, CHOLHDL, LDLDIRECT in the last 72 hours. Thyroid Function Tests: No results for input(s): TSH, T4TOTAL, FREET4, T3FREE, THYROIDAB in the last 72 hours.  Anemia Panel: No results for input(s): VITAMINB12, FOLATE, FERRITIN, TIBC, IRON, RETICCTPCT in the last 72 hours. Sepsis Labs: No results for input(s): PROCALCITON, LATICACIDVEN in the last 168 hours.  Recent Results (from the past 240 hour(s))  Resp Panel by RT-PCR (Flu A&B, Covid) Nasopharyngeal Swab     Status: None   Collection Time: 07/26/21  3:28 PM   Specimen: Nasopharyngeal Swab; Nasopharyngeal(NP) swabs in vial transport medium  Result Value Ref Range Status   SARS Coronavirus 2 by RT PCR NEGATIVE NEGATIVE Final    Comment: (NOTE) SARS-CoV-2 target nucleic acids are NOT DETECTED.  The SARS-CoV-2  RNA is generally detectable in upper respiratory specimens during the acute phase of infection. The lowest concentration of SARS-CoV-2 viral copies this assay can detect is 138 copies/mL. A negative result does not preclude SARS-Cov-2 infection and should not be used as the sole basis for treatment or other patient management decisions. A negative result may occur with  improper specimen collection/handling, submission of specimen other than nasopharyngeal swab, presence of viral mutation(s) within the areas targeted by this assay, and inadequate number of viral copies(<138 copies/mL). A negative result must be combined  with clinical observations, patient history, and epidemiological information. The expected result is Negative.  Fact Sheet for Patients:  BloggerCourse.com  Fact Sheet for Healthcare Providers:  SeriousBroker.it  This test is no t yet approved or cleared by the [place] FDA and  has been authorized for detection and/or diagnosis of SARS-CoV-2 by FDA under an Emergency Use Authorization (EUA). This EUA will remain  in effect (meaning this test can be used) for the duration of the COVID-19 declaration under Section 564(b)(1) of the Act, 21 U.S.C.section 360bbb-3(b)(1), unless the authorization is terminated  or revoked sooner.       Influenza A by PCR NEGATIVE NEGATIVE Final   Influenza B by PCR NEGATIVE NEGATIVE Final    Comment: (NOTE) The Xpert Xpress SARS-CoV-2/FLU/RSV plus assay is intended as an aid in the diagnosis of influenza from Nasopharyngeal swab specimens and should not be used as a sole basis for treatment. Nasal washings and aspirates are unacceptable for Xpert Xpress SARS-CoV-2/FLU/RSV testing.  Fact Sheet for Patients: BloggerCourse.com  Fact Sheet for Healthcare Providers: SeriousBroker.it  This test is not yet approved or cleared by the  [place] FDA and has been authorized for detection and/or diagnosis of SARS-CoV-2 by FDA under an Emergency Use Authorization (EUA). This EUA will remain in effect (meaning this test can be used) for the duration of the COVID-19 declaration under Section 564(b)(1) of the Act, 21 U.S.C. section 360bbb-3(b)(1), unless the authorization is terminated or revoked.  Performed at [facility], [address], [place], [place] [address]           Radiology Studies: No results found.      Scheduled Meds:  furosemide  40 mg Intravenous BID   guaiFENesin  600 mg Oral BID   heparin injection (subcutaneous)  5,000 Units Subcutaneous Q8H   ipratropium-albuterol  3 mL Nebulization TID   mirtazapine  15 mg Oral QHS   sertraline  150 mg Oral Daily   sodium chloride flush  3 mL Intravenous Q12H   Continuous Infusions:   LOS: 3 days    Time spent: 35 minutes    Dabid Godown A Leilah Polimeni, MD [facility]   If 7PM-7AM, please contact night-coverage [email or web address]  07/30/2021, 11:25 AM

## 2021-07-31 DIAGNOSIS — J9601 Acute respiratory failure with hypoxia: Secondary | ICD-10-CM | POA: Diagnosis not present

## 2021-07-31 DIAGNOSIS — J9602 Acute respiratory failure with hypercapnia: Secondary | ICD-10-CM | POA: Diagnosis not present

## 2021-07-31 LAB — BASIC METABOLIC PANEL
Anion gap: 6 (ref 5–15)
BUN: 18 mg/dL (ref 6–20)
CO2: 37 mmol/L — ABNORMAL HIGH (ref 22–32)
Calcium: 8.3 mg/dL — ABNORMAL LOW (ref 8.9–10.3)
Chloride: 96 mmol/L — ABNORMAL LOW (ref 98–111)
Creatinine, Ser: 1.25 mg/dL — ABNORMAL HIGH (ref 0.61–1.24)
GFR, Estimated: >60 mL/min (ref >60)
Glucose, Bld: 123 mg/dL — ABNORMAL HIGH (ref 70–99)
Potassium: 3.8 mmol/L (ref 3.5–5.1)
Sodium: 139 mmol/L (ref 135–145)

## 2021-07-31 MED ORDER — FUROSEMIDE 40 MG PO TABS
40.0000 mg | ORAL_TABLET | Freq: Two times a day (BID) | ORAL | Status: DC
  Administered 2021-07-31 – 2021-08-11 (×23): 40 mg via ORAL
  Filled 2021-07-31 (×23): qty 1

## 2021-07-31 MED ORDER — TRAMADOL HCL 50 MG PO TABS
50.0000 mg | ORAL_TABLET | Freq: Three times a day (TID) | ORAL | Status: DC | PRN
Start: 2021-07-31 — End: 2021-08-11

## 2021-07-31 MED ORDER — CAPSAICIN 0.075 % EX CREA: TOPICAL_CREAM | Freq: Two times a day (BID) | CUTANEOUS | Status: DC

## 2021-07-31 MED ORDER — NYSTATIN 100000 UNIT/GM EX POWD
Freq: Three times a day (TID) | CUTANEOUS | Status: DC
  Filled 2021-07-31: qty 15

## 2021-07-31 MED ORDER — DICLOFENAC SODIUM 1 % EX GEL
2.0000 g | Freq: Four times a day (QID) | CUTANEOUS | Status: DC
  Administered 2021-07-31 – 2021-08-11 (×29): 2 g via TOPICAL
  Filled 2021-07-31: qty 100

## 2021-07-31 MED ORDER — POTASSIUM CHLORIDE CRYS ER 20 MEQ PO TBCR
20.0000 meq | EXTENDED_RELEASE_TABLET | Freq: Once | ORAL | Status: AC
  Administered 2021-07-31: 20 meq via ORAL
  Filled 2021-07-31: qty 1

## 2021-07-31 MED ORDER — ACETAMINOPHEN 500 MG PO TABS
500.0000 mg | ORAL_TABLET | Freq: Three times a day (TID) | ORAL | Status: AC
  Administered 2021-07-31 – 2021-08-02 (×9): 500 mg via ORAL
  Filled 2021-07-31 (×9): qty 1

## 2021-07-31 NOTE — Plan of Care (Signed)
  Problem: Education: Goal: Knowledge of General Education information will improve Description: Including pain rating scale, medication(s)/side effects and non-pharmacologic comfort measures Outcome: Progressing   Problem: Activity: Goal: Risk for activity intolerance will decrease Outcome: Progressing   Problem: Nutrition: Goal: Adequate nutrition will be maintained Outcome: Progressing   Problem: Coping: Goal: Level of anxiety will decrease Outcome: Progressing   

## 2021-07-31 NOTE — Consult Note (Signed)
WOC Nurse Consult Note: Reason for Consult:intertriginous dermatitis in the abdominal skin folds Wound type:Moisture associated skin damage  ICD-10 CM Codes for Irritant Dermatitis  L30.4  - Erythema intertrigo. Also used for abrasion of the hand, chafing of the skin, dermatitis due to sweating and friction, friction dermatitis, friction eczema, and genital/thigh intertrigo.   Pressure Injury POA:N/A I have provided Nursing with guidance for the care and management of the subpannicular and bilateral inguinal intertriginous dermatitis using our house antimicrobial wicking textile, InterDry.  WOC nursing team will not follow, but will remain available to this patient, the nursing and medical teams.  Please re-consult if needed. Thanks, Ladona Mow, MSN, RN, GNP, [facility]  Pager# [phone]

## 2021-07-31 NOTE — Progress Notes (Signed)
PROGRESS NOTE    Hunter King  FUX:[id number] DOB: 1966-05-14 DOA: 07/26/2021 PCP: Avis Epley, PA-C   Brief Narrative: 55 year old with past medical history significant for anxiety, depression, lymphedema, morbid obesity presents after having a fall at home.  Patient fell at home and passed out.  That time patient was evaluated at [facility] they check x-ray of his knee and he was discharged home. He presented to the ED [facility] again for further evaluation of syncopal episode. Patient was also noted to be hypoxic down to 87 on room air.  Chest x-ray showed cardiomegaly.  CT angio negative for PE, showed mild diffuse groundglass opacities suggest atelectasis or mild edema.    Assessment & Plan:   Principal Problem:   Acute respiratory failure with hypoxia and hypercapnia (HCC) Active Problems:   Syncope and collapse   Bradycardia   Cardiomegaly   Morbid obesity with BMI of 70 and over, adult (HCC)   Lymphedema   Debility   Anxiety and depression   Syncope  1-Acute hypoxic hypercapnic respiratory failure in the setting of obesity hypoventilation syndrome, acute on chronic diastolic heart failure exacerbation: -Plan to transition IV lasix to oral 8/15 -He will need BiPAP at discharge. -BiPAP at bedtime while in the hospital. -Negative 18 L. Renal function stable.  -Monitor renal function.  -Continue with  Guaifenesin and Nebulizer.  -Weight 649--604--  2-Syncope: Likely Vasovagal.  CTA negative for PE. ECHO no aortic valve stenosis.   Lymphedema, chronic: Continue with wound care  Elevated D dimer;  CT negative for PE>  [place] negative for DVT.   Anxiety, Depression:  Continue with Zoloft and Mirtazapine.   Morbid obesity  BMI 83.  Needs lives style modification.   Shoulder, knee pain. Start tylenol schedule for few days, Voltaren gel ordered.   Axilla intertriginous Dermatitis; start Nystatin.      Estimated body mass index is 77.67 kg/m  as calculated from the following:   Height as of 07/25/21: 6\' 2"  (1.88 m).   Weight as of this encounter: 274.4 kg.   DVT prophylaxis: Heparin Code Status: Full code Family Communication: Family 8/15 sister who was at bedside.  Disposition Plan:  Status is: Inpatient  Remains inpatient appropriate because:IV treatments appropriate due to intensity of illness or inability to take PO  Dispo: The patient is from: Home              Anticipated d/c is to:  CIR versus SNF  rehab              Patient currently is medically stable to d/c. Awaiting CIR   Difficult to place patient No        Consultants:  Pulmonologist  Procedures:  Echo: Grade 2 diastolic dysfunction  Antimicrobials:    Subjective: He report shoulder and knees pain. He has pain when he raise his left arm and shoulder.  He has notice difference in fluid retention since he has been follow low sodium diet. He is motivated to loose weight. Awaiting nutritionist consult.  He has been using BIPAP at HS> He has been compliant.   Objective: Vitals:   07/30/21 1936 07/30/21 2343 07/31/21 0515 07/31/21 0932  BP:   (!) 142/68   Pulse: (!) 55 (!) 58 64   Resp: 18 (!) 27 20 18   Temp:   97.7 F (36.5 C)   TempSrc:   Oral   SpO2: 99% 99% 98%   Weight:  Intake/Output Summary (Last 24 hours) at 07/31/2021 1232 Last data filed at 07/31/2021 1610 Gross per 24 hour  Intake 480 ml  Output 4100 ml  Net -3620 ml    Filed Weights   07/29/21 0355 07/30/21 0438  Weight: (!) 294.8 kg (!) 274.4 kg    Examination:  General exam: NAD  Respiratory system: BL air  movement, no wheezing.  Cardiovascular system: S 1, S 2 RRR Gastrointestinal system: BS present, soft, nt Central nervous system: Alert  Extremities: LE with dressing.     Data Reviewed: I have personally reviewed following labs and imaging studies  CBC: Recent Labs  Lab 07/26/21 1414 07/26/21 1549 07/27/21 0400 07/30/21 0252  WBC 6.4  --  6.7  6.4  NEUTROABS 4.8  --   --   --   HGB 12.2* 15.3 11.4* 11.8*  HCT 40.7 45.0 37.7* 38.3*  MCV 89.8  --  90.2 88.9  PLT 203  --  178 187    Basic Metabolic Panel: Recent Labs  Lab 07/26/21 1414 07/26/21 1549 07/28/21 0243 07/29/21 0848 07/30/21 0252 07/31/21 0515  NA 137 141 138 138 138 139  K 4.2 4.0 3.9 4.0 3.8 3.8  CL 101  --  98 96* 96* 96*  CO2 28  --  34* 28 33* 37*  GLUCOSE 105*  --  113* 105* 113* 123*  BUN 16  --  14 13 17 18   CREATININE 1.19  --  1.25* 1.14 1.25* 1.25*  CALCIUM 8.1*  --  8.0* 8.2* 8.4* 8.3*    GFR: Estimated Creatinine Clearance: 153.8 mL/min (A) (by C-G formula based on SCr of 1.25 mg/dL (H)). Liver Function Tests: Recent Labs  Lab 07/26/21 1414  AST 20  ALT 14  ALKPHOS 96  BILITOT 1.1  PROT 6.8  ALBUMIN 2.9*    No results for input(s): LIPASE, AMYLASE in the last 168 hours. No results for input(s): AMMONIA in the last 168 hours. Coagulation Profile: Recent Labs  Lab 07/26/21 1600  INR 1.3*    Cardiac Enzymes: No results for input(s): CKTOTAL, CKMB, CKMBINDEX, TROPONINI in the last 168 hours. BNP (last 3 results) No results for input(s): PROBNP in the last 8760 hours. HbA1C: No results for input(s): HGBA1C in the last 72 hours. CBG: No results for input(s): GLUCAP in the last 168 hours. Lipid Profile: No results for input(s): CHOL, HDL, LDLCALC, TRIG, CHOLHDL, LDLDIRECT in the last 72 hours. Thyroid Function Tests: No results for input(s): TSH, T4TOTAL, FREET4, T3FREE, THYROIDAB in the last 72 hours.  Anemia Panel: No results for input(s): VITAMINB12, FOLATE, FERRITIN, TIBC, IRON, RETICCTPCT in the last 72 hours. Sepsis Labs: No results for input(s): PROCALCITON, LATICACIDVEN in the last 168 hours.  Recent Results (from the past 240 hour(s))  Resp Panel by RT-PCR (Flu A&B, Covid) Nasopharyngeal Swab     Status: None   Collection Time: 07/26/21  3:28 PM   Specimen: Nasopharyngeal Swab; Nasopharyngeal(NP) swabs in vial  transport medium  Result Value Ref Range Status   SARS Coronavirus 2 by RT PCR NEGATIVE NEGATIVE Final    Comment: (NOTE) SARS-CoV-2 target nucleic acids are NOT DETECTED.  The SARS-CoV-2 RNA is generally detectable in upper respiratory specimens during the acute phase of infection. The lowest concentration of SARS-CoV-2 viral copies this assay can detect is 138 copies/mL. A negative result does not preclude SARS-Cov-2 infection and should not be used as the sole basis for treatment or other patient management decisions. A negative result may occur with  improper specimen collection/handling, submission of specimen other than nasopharyngeal swab, presence of viral mutation(s) within the areas targeted by this assay, and inadequate number of viral copies(<138 copies/mL). A negative result must be combined with clinical observations, patient history, and epidemiological information. The expected result is Negative.  Fact Sheet for Patients:  BloggerCourse.com  Fact Sheet for Healthcare Providers:  SeriousBroker.it  This test is no t yet approved or cleared by the [place] FDA and  has been authorized for detection and/or diagnosis of SARS-CoV-2 by FDA under an Emergency Use Authorization (EUA). This EUA will remain  in effect (meaning this test can be used) for the duration of the COVID-19 declaration under Section 564(b)(1) of the Act, 21 U.S.C.section 360bbb-3(b)(1), unless the authorization is terminated  or revoked sooner.       Influenza A by PCR NEGATIVE NEGATIVE Final   Influenza B by PCR NEGATIVE NEGATIVE Final    Comment: (NOTE) The Xpert Xpress SARS-CoV-2/FLU/RSV plus assay is intended as an aid in the diagnosis of influenza from Nasopharyngeal swab specimens and should not be used as a sole basis for treatment. Nasal washings and aspirates are unacceptable for Xpert Xpress SARS-CoV-2/FLU/RSV testing.  Fact  Sheet for Patients: BloggerCourse.com  Fact Sheet for Healthcare Providers: SeriousBroker.it  This test is not yet approved or cleared by the [place] FDA and has been authorized for detection and/or diagnosis of SARS-CoV-2 by FDA under an Emergency Use Authorization (EUA). This EUA will remain in effect (meaning this test can be used) for the duration of the COVID-19 declaration under Section 564(b)(1) of the Act, 21 U.S.C. section 360bbb-3(b)(1), unless the authorization is terminated or revoked.  Performed at [facility], [address], [place], [place] [address]           Radiology Studies: No results found.      Scheduled Meds:  acetaminophen  500 mg Oral TID   diclofenac Sodium  2 g Topical QID   furosemide  40 mg Oral BID   guaiFENesin  600 mg Oral BID   heparin injection (subcutaneous)  5,000 Units Subcutaneous Q8H   ipratropium-albuterol  3 mL Nebulization TID   mirtazapine  15 mg Oral QHS   nystatin   Topical TID   sertraline  150 mg Oral Daily   sodium chloride flush  3 mL Intravenous Q12H   Continuous Infusions:   LOS: 4 days    Time spent: 35 minutes    Evone Arseneau A Michael Walrath, MD [facility]   If 7PM-7AM, please contact night-coverage [email or web address]  07/31/2021, 12:32 PM

## 2021-07-31 NOTE — Plan of Care (Signed)

## 2021-07-31 NOTE — Progress Notes (Signed)
Physical Therapy Treatment Patient Details Name: Hunter King MRN: [id number] DOB: August 16, 1966 Today's Date: 07/31/2021    History of Present Illness Pt is a 55yo male who presents to [facility] ED on 8/10 secondary to recent syncope on 8/9. Of note, was seen at [facility] ED 8/9 and evaluated for left knee pain but pt and family did not feel that syncopal episode was appropriately evaluated. Noted to be hypoxic. CTA of Chest showed mild diffuse ground-glass opacities suggestive of atelectasis or mild  edema. PMH: chronic lymphodema, DM, Morbid obesity, R knee surgery, bradycardia, anxiety, depression.    PT Comments    Patient seen for session of working towards standing utilizing verticalization/tilt bed. See below for specific VS at each elevation of the bed. Of note, pt's BP consistently rose with each change in position, however once stationary at 60 degrees for ~5 minutes, his BP dropped from 170/81 to 129/93 with pt asymptomatic. Patient immediately reclined to 13 degrees and BP elevated to 146/68. When returned to zero degrees tilt and HOB elevated to 42 degrees, pt did report period of feeling light-headed that lasted several minutes. BP 127/53.  Will plan to continue use of tilt bed for safety and to further assess pt's BP response to upright positioning.     07/31/21 1553 07/31/21 1554 07/31/21 1555  Tilt Bed  Start Time 1417  --   --   Angle 10 degrees 25 degrees 35 degrees  BP 139/68 136/67 (!) 152/70  Pulse Rate 67 67 70    07/31/21 1556 07/31/21 1557 07/31/21 1603  Tilt Bed  Start Time  --   --   --   Angle 50 degrees 60 degrees 60 degrees  BP (!) 155/71 (!) 170/81 (!) 129/93  Pulse Rate 70 78 84    07/31/21 1604  Tilt Bed  Start Time  --   Angle 13 degrees  BP (!) 146/68  Pulse Rate 71    Follow Up Recommendations  Other (comment);CIR (pending progression; if pt progresses well, may be able to d/c home with HHPT)     Equipment Recommendations  Other  (comment);Hospital bed (Bariatric walker, bariatric BSC)    Recommendations for Other Services       Precautions / Restrictions Precautions Precautions: Fall Precaution Comments: OCD; agoraphobic; skin breakdown    Mobility  Bed Mobility Overal bed mobility: Needs Assistance             General bed mobility comments: pt scooted toward HOB with rails and supervision/cues Start Time: 1417 Angle: 13 degrees Patient Response: Anxious  Transfers Overall transfer level: Needs assistance Equipment used:  (Tilt bed)             General transfer comment: tilt bed gradually elevated to 60 degrees; pt able to bear weight through left knee without pain  Ambulation/Gait             General Gait Details: Deferred   Stairs             Wheelchair Mobility    Modified Rankin (Stroke Patients Only)       Balance                                            Cognition Arousal/Alertness: Awake/alert Behavior During Therapy: Anxious Overall Cognitive Status: Within Functional Limits for tasks assessed  General Comments: anxious with mobility, benefits from therapist verbal encouragement      Exercises Other Exercises Other Exercises: unilateral knee flexion/extension x 5 each side Other Exercises: bil knee flexion/minisquat x 10 at 50 and 60 degrees    General Comments        Pertinent Vitals/Pain Pain Assessment: No/denies pain    Home Living                      Prior Function            PT Goals (current goals can now be found in the care plan section) Acute Rehab PT Goals Patient Stated Goal: to do some rehab before going home; to not fall Time For Goal Achievement: 08/10/21 Potential to Achieve Goals: Fair Progress towards PT goals: Progressing toward goals    Frequency    Min 2X/week      PT Plan Current plan remains appropriate    Co-evaluation               AM-PAC PT "6 Clicks" Mobility   Outcome Measure  Help needed turning from your back to your side while in a flat bed without using bedrails?: A Little Help needed moving from lying on your back to sitting on the side of a flat bed without using bedrails?: Total Help needed moving to and from a bed to a chair (including a wheelchair)?: Total Help needed standing up from a chair using your arms (e.g., wheelchair or bedside chair)?: Total Help needed to walk in hospital room?: Total Help needed climbing 3-5 steps with a railing? : Total 6 Click Score: 8    End of Session   Activity Tolerance: Treatment limited secondary to medical complications (Comment) (BP drop (?anxiety related)) Patient left: in bed;with call bell/phone within reach (on bariatric bed in ED) Nurse Communication: Mobility status PT Visit Diagnosis: History of falling (Z91.81);Muscle weakness (generalized) (M62.81);Difficulty in walking, not elsewhere classified (R26.2)     Time: 1359-1500 PT Time Calculation (min) (ACUTE ONLY): 61 min  Charges:  $Therapeutic Exercise: 8-22 mins $Therapeutic Activity: 38-52 mins                      Jerolyn Center, PT Pager [phone]    Zena Amos 07/31/2021, 4:15 PM

## 2021-07-31 NOTE — TOC Initial Note (Signed)
Transition of Care [facility]) - Initial/Assessment Note    Patient Details  Name: Hunter King MRN: [id number] Date of Birth: 10/12/1966  [facility] CM/SW Contact:    Joanne Chars, LCSW Phone Number: 07/31/2021, 3:11 PM  Clinical Narrative:   CSW met with pt and sister Vivien Rota regarding discharge plan.  Permission given to speak with sister present and also with wife Levada Dy.  Pt lives at home with wife, has not been out of his home in several years.  Has been working with [facility] but has not progressed to better mobility.  Pt reports he has fallen twice at home recently and does not feel safe in his current state at home based on the falls.  We discussed current options of CIR, discussed that at his current weight, SNF not an option.  Pt wants rehab with a goal of going home.  No DME in home except a cane.  Pt reports his home is too small for a bariatric walker that was suggested.  PCP in place, but pt not very happy with current PCP.  Discussed trilogy/bipap/cpap and pt does not have any of these at home currently.  These have been brought up recently and pt was told he would be getting something.    CSW spoke with Shann Medal at [facility] who reports pt currently requiring 3 person assist based on PT note and he would need to be at a point where he required less assistance before he could potentially go to CIR.    1500:  CSW spoke with Zack/[facility] who initiated trilogy order.  Bedside spirometry ordered on 8/12 to consider new diagnosis.  CSW confirmed with respiratory that this is scheduled for tomorrow at 11am.    CSW spoke with Lynn/PT who said pt has made improvements, was 2 person assistance for today's session.                    Expected Discharge Plan: IP Rehab Facility Barriers to Discharge: Other (must enter comment) (per CIR, not yet candidate based on how much assistance he is requiring)   Patient Goals and CMS Choice Patient states their goals for this  hospitalization and ongoing recovery are:: healthy weight, bac, to being more active      Expected Discharge Plan and Services Expected Discharge Plan: [place]     Post Acute Care Choice: IP Rehab Living arrangements for the past 2 months: Single Family Home                                      Prior Living Arrangements/Services Living arrangements for the past 2 months: Single Family Home Lives with:: Spouse Patient language and need for interpreter reviewed:: Yes Do you feel safe going back to the place where you live?: No   Pt reports he has fallen twice at home recently, does not want to return until he is more stable with his mobility  Need for Family Participation in Patient Care: Yes (Comment) Care giver support system in place?: Yes (comment) Current home services: Home OT, Home PT Criminal Activity/Legal Involvement Pertinent to Current Situation/Hospitalization: No - Comment as needed  Activities of Daily Living      Permission Sought/Granted Permission sought to share information with : Family Supports Permission granted to share information with : Yes, Verbal Permission Granted  Share Information with NAME: wife Levada Dy, sister Vivien Rota  Emotional Assessment Appearance:: Appears stated age Attitude/Demeanor/Rapport: Engaged Affect (typically observed): Appropriate, Pleasant Orientation: : Oriented to Self, Oriented to Place, Oriented to  Time, Oriented to Situation Alcohol / Substance Use: Not Applicable Psych Involvement: No (comment)  Admission diagnosis:  Syncope [R55] Dyspnea [R06.00] Acute respiratory failure with hypoxia ([place]) [J96.01] Patient Active Problem List   Diagnosis Date Noted   Bradycardia 07/27/2021   Cardiomegaly 07/27/2021   Acute respiratory failure with hypoxia and hypercapnia ([place]) 07/27/2021   Morbid obesity with BMI of 70 and over, adult ([place]) 07/27/2021   Lymphedema 07/27/2021   Debility 07/27/2021    Anxiety and depression 07/27/2021   Syncope 07/27/2021   Obesity hypoventilation syndrome ([place])    Syncope and collapse 07/26/2021   PCP:  Jake Samples, PA-C Pharmacy:   [facility] [address] [place] [address] Phone: [phone] Fax: [phone]     Social Determinants of Health (SDOH) Interventions    Readmission Risk Interventions No flowsheet data found.

## 2021-08-01 ENCOUNTER — Inpatient Hospital Stay (HOSPITAL_COMMUNITY): Payer: No Typology Code available for payment source

## 2021-08-01 DIAGNOSIS — J9602 Acute respiratory failure with hypercapnia: Secondary | ICD-10-CM | POA: Diagnosis not present

## 2021-08-01 DIAGNOSIS — J9601 Acute respiratory failure with hypoxia: Secondary | ICD-10-CM | POA: Diagnosis not present

## 2021-08-01 LAB — SPIROMETRY WITH GRAPH
FEF 25-75 Pre: 0.88 L/sec
FEF2575-%Pred-Pre: 23 %
FEV1-%Pred-Pre: 31 %
FEV1-Pre: 1.37 L
FEV1FVC-%Pred-Pre: 93 %
FEV6-%Pred-Pre: 34 %
FEV6-Pre: 1.86 L
FEV6FVC-%Pred-Pre: 101 %
FVC-%Pred-Pre: 33 %
FVC-Pre: 1.9 L
Pre FEV1/FVC ratio: 72 %
Pre FEV6/FVC Ratio: 98 %

## 2021-08-01 NOTE — Discharge Instructions (Addendum)
Outpatient Education  You have been referred to [facility]'s Nutrition and Diabetes Education Services for outpatient weight loss counseling. A referral has been sent and the office will contact you for an appointment. For additional questions or to schedule an appointment, call [phone].    Tips to Support Weight Loss   You will have a better chance of succeeding at weight loss by changing your eating habits. Changing habits can be difficult, so this handout offers tips to help you achieve your goal.   Set Yourself up for Success   Keeping a food diary to record everything you eat and drink can help you be more mindful of your food choices. Your diary is your tool, so use whatever approach you like best: smartphone app, website, or paper/pencil. Record what you eat or drink immediately after you finish eating. It might seem overwhelming to write down everything. Start with the meal or time of day which you find the greatest challenge to manage.   Additional strategies that can set you up for success:  Measure your foods to be sure they match the portions on your daily plan. Keep your measuring cups and food scale on the kitchen counter to help remember to measure.  Find a friend to support your change in habits. Be sure to tell your friend what kind of help will meet your needs. For example, do you need a walking partner, or do you want a sounding board for when the eating plan is challenging?  Believe in yourself. Changing behavior is not just about willpower. It is also about believing you can do it. Your registered dietitian nutritionist (RDN) can recommend resources to help you succeed.   Consider adopting the following strategies to help with weight loss:  Keep fresh fruit on your kitchen counter or at eye-level in the refrigerator so you are reminded it's available for a snack.  Consider using a meal delivery service for a few weeks to decrease trips to the grocery store, cut down on  meal preparation effort, and to get recipe ideas. To balance the cost of this service, cut back on dining out.  Add your own vegetables to frozen meals designed for weight-loss to get extra nutrients.  Enjoy liquid meal replacements with a side dish of salad or fruit.   Cooking to Lose Weight   If you never cook for yourself, now is a great time to start! Cooking for yourself is one of the best ways to control your diet and know exactly what ingredients are in the food.  Think about how you prepare foods to figure out where you can cut some calories and fat without losing flavor. You may not even realize how many calories are adding up with certain cooking methods. Here are some ideas:   Food Item/Ingredient Strategy  Oil Use a nonstick skillet and use just a splash of oil (1-2 tablespoons) instead of deep frying.  Seasoning Adding herbs and spices can help you boost flavor while cutting down on fats like butter and oil. Parsley, chive, garlic, onion powder, paprika, pepper, and ginger will help make meals more flavorful.   Dairy Use lower-fat dairy products in place of full-fat versions. For example, use lowfat or skim [place] yogurt instead of sour cream, or skim or 1% milk instead of 2% or whole milk, or light margarine instead of solid margarine or butter.   Vegetables Vegetables taste better when roasted or browned. Try it with your favorite vegetables.   Find ways to  add vegetables to your meals, like scrambled eggs with green peppers, roasted broccoli in macaroni and cheese, or finely diced mushrooms and zucchini in pasta sauce, or vegetables instead of meat on pizza.  Fruit Find interesting ways to prepare fruits. For example, eat apple wedges with a dab of peanut butter and sprinkle of cinnamon or add a dollop of light whipped cream and a teaspoon of granola to fresh or canned peaches.    Grocery Shopping Strategies   Buy fresh or frozen fruits and vegetables that you enjoy so they are  ready to snack on or to add to meals.  Wash and cut up vegetables as soon as you get home from the store so they are ready to eat.  Consider leaving snack chips, cookies, sweets, and other similar foods off your shopping list. You may choose to add them at a later time when you figure out how they fit into your food plan   Copyright Academy of Nutrition and Dietetics. This handout may be duplicated for client education.      Follow with Primary MD Avis Epley, PA-C in 7 days   Get CBC, CMP,  checked  by Primary MD next visit.    Activity: As tolerated with Full fall precautions use walker/cane & assistance as needed   Disposition Home    Diet: Heart Healthy  .  For Heart failure patients - Check your Weight same time everyday, if you gain over 2 pounds, or you develop in leg swelling, experience more shortness of breath or chest pain, call your Primary MD immediately. Follow Cardiac Low Salt Diet and 1.5 lit/day fluid restriction.   On your next visit with your primary care physician please Get Medicines reviewed and adjusted.   Please request your Prim.MD to go over all Hospital Tests and Procedure/Radiological results at the follow up, please get all Hospital records sent to your Prim MD by signing hospital release before you go home.   If you experience worsening of your admission symptoms, develop shortness of breath, life threatening emergency, suicidal or homicidal thoughts you must seek medical attention immediately by calling 911 or calling your MD immediately  if symptoms less severe.  You Must read complete instructions/literature along with all the possible adverse reactions/side effects for all the Medicines you take and that have been prescribed to you. Take any new Medicines after you have completely understood and accpet all the possible adverse reactions/side effects.   Do not drive, operating heavy machinery, perform activities at heights, swimming or  participation in water activities or provide baby sitting services if your were admitted for syncope or siezures until you have seen by Primary MD or a Neurologist and advised to do so again.  Do not drive when taking Pain medications.    Do not take more than prescribed Pain, Sleep and Anxiety Medications  Special Instructions: If you have smoked or chewed Tobacco  in the last 2 yrs please stop smoking, stop any regular Alcohol  and or any Recreational drug use.  Wear Seat belts while driving.   Please note  You were cared for by a hospitalist during your hospital stay. If you have any questions about your discharge medications or the care you received while you were in the hospital after you are discharged, you can call the unit and asked to speak with the hospitalist on call if the hospitalist that took care of you is not available. Once you are discharged, your primary care physician will  handle any further medical issues. Please note that NO REFILLS for any discharge medications will be authorized once you are discharged, as it is imperative that you return to your primary care physician (or establish a relationship with a primary care physician if you do not have one) for your aftercare needs so that they can reassess your need for medications and monitor your lab values.

## 2021-08-01 NOTE — Progress Notes (Signed)
Occupational Therapy Treatment Patient Details Name: Hunter King MRN: [id number] DOB: 1966/02/10 Today's Date: 08/01/2021    History of present illness Pt is a 55yo male who presents to [facility] ED on 8/10 secondary to recent syncope on 8/9. Of note, was seen at [facility] ED 8/9 and evaluated for left knee pain but pt and family did not feel that syncopal episode was appropriately evaluated. Noted to be hypoxic. CTA of Chest showed mild diffuse ground-glass opacities suggestive of atelectasis or mild  edema. PMH: chronic lymphodema, DM, Morbid obesity, R knee surgery, bradycardia, anxiety, depression.   OT comments  Excellent session. Tilt feature used and pt able to tolerate tilt @ 70 degrees in upright standing for at least 7 minutes while completing BU/LE exercise. Pt would have been able to tolerate standing for longer period of time. Also completed exercise/activities in sitting to focus on trunk strengthening and rotation necessary for bed mobility. Discussed use of AE for LB ADL. Friend present during session. Pt very motivated to participate with OT and has excellent rehab potential. Discussed POC with PT. Will continue to follow acutely to progress independence with mobility and ADL .   Follow Up Recommendations  CIR    Equipment Recommendations  Other (comment) (bariatric RW; bariactric BSC)    Recommendations for Other Services Rehab consult    Precautions / Restrictions Precautions Precautions: Fall Precaution Comments: OCD; agoraphobic; skin breakdown       Mobility Bed Mobility               General bed mobility comments: Tilt feature used to increase standing tolerance. ableto stand x 7 min @ 67 degrees and while completing exercises (Pt in seated chair position pulling forward adn reaching across midlien to work on core strengthening)    Transfers                      Balance     Sitting balance-Leahy Scale: Good                                      ADL either performed or assessed with clinical judgement   ADL                                         General ADL Comments: encouraged pt to complete as much of his ADL tasks as possible     Vision       Perception     Praxis      Cognition Arousal/Alertness: Awake/alert Behavior During Therapy: Anxious (minimally) Overall Cognitive Status: Within Functional Limits for tasks assessed                                          Exercises Exercises: General Upper Extremity General Exercises - Upper Extremity Shoulder Flexion: AROM;10 reps;Standing Elbow Flexion: AROM;Both;10 reps;Standing General Exercises - Lower Extremity Heel Slides: AROM;Both;10 reps;Supine Other Exercises Other Exercises: Mini squats in standing with straps to secure Other Exercises: alteranating glut exerciese in standing Other Exercises: alternating lateral weight shifts to simulate walking in standing with straps secured   Shoulder Instructions       General Comments      Pertinent  Vitals/ Pain       Pain Assessment: Faces Faces Pain Scale: Hurts little more Pain Location: BLE wtih weight bearing Pain Descriptors / Indicators: Grimacing;Guarding Pain Intervention(s): Limited activity within patient's tolerance  Home Living                                          Prior Functioning/Environment              Frequency  Min 2X/week        Progress Toward Goals  OT Goals(current goals can now be found in the care plan section)  Progress towards OT goals: Progressing toward goals  Acute Rehab OT Goals Patient Stated Goal: to do some rehab before going home; to not fall OT Goal Formulation: With patient Time For Goal Achievement: 08/10/21 Potential to Achieve Goals: Good ADL Goals Pt Will Perform Lower Body Bathing: with adaptive equipment;with supervision;with set-up Pt Will Perform Lower Body Dressing: with  supervision;with set-up;with adaptive equipment;sit to/from stand Pt Will Transfer to Toilet: with modified independence;ambulating Pt Will Perform Toileting - Clothing Manipulation and hygiene: with modified independence;sit to/from stand;sitting/lateral leans;with adaptive equipment Pt/caregiver will Perform Home Exercise Program: Increased strength;Independently;With theraband;With written HEP provided Additional ADL Goal #1: Pt will verbalize 2 strateiges to manage MASD in skin folds and assist with care  Plan Discharge plan remains appropriate    Co-evaluation                 AM-PAC OT "6 Clicks" Daily Activity     Outcome Measure   Help from another person eating meals?: None Help from another person taking care of personal grooming?: A Little Help from another person toileting, which includes using toliet, bedpan, or urinal?: A Lot Help from another person bathing (including washing, rinsing, drying)?: A Lot Help from another person to put on and taking off regular upper body clothing?: A Little Help from another person to put on and taking off regular lower body clothing?: A Lot 6 Click Score: 16    End of Session    OT Visit Diagnosis: Other abnormalities of gait and mobility (R26.89);Muscle weakness (generalized) (M62.81);History of falling (Z91.81);Pain Pain - Right/Left: Left Pain - part of body: Shoulder;Knee   Activity Tolerance Patient tolerated treatment well   Patient Left in bed;with call bell/phone within reach;with family/visitor present (chair position)   Nurse Communication Mobility status;Other (comment) (encourage use of tilt bed)        Time: 3532-9924 OT Time Calculation (min): 41 min  Charges: OT General Charges $OT Visit: 1 Visit OT Treatments $Therapeutic Activity: 8-22 mins $Therapeutic Exercise: 23-37 mins  [name], OT/L   Acute OT Clinical Specialist Acute Rehabilitation Services Pager [phone] Office [phone]     [facility] 08/01/2021, 5:01 PM

## 2021-08-01 NOTE — Progress Notes (Signed)
Inpatient Rehab Admissions Coordinator:   Consult received.  Pt not yet at a level to tolerate intense CIR therapies.  Will continue to follow for progress, but he will need to be able to stand from EOB, and participate in pre-gait activities to demonstrate potential candidacy.  Per documentation his home is not w/c accessible and if he is unable to demonstrate ability to progress towards ambulation quickly, he would not be a candidate for short term rehab at CIR.   Estill Dooms, PT, DPT Admissions Coordinator [phone] 08/01/21  12:51 PM

## 2021-08-01 NOTE — Progress Notes (Signed)
PROGRESS NOTE    Hunter King  IBB:[id number] DOB: 04-18-1966 DOA: 07/26/2021 PCP: Avis Epley, PA-C   Brief Narrative: 55 year old with past medical history significant for anxiety, depression, lymphedema, morbid obesity presents after having a fall at home.  Patient fell at home and passed out.  That time patient was evaluated at [facility] they check x-ray of his knee and he was discharged home. He presented to the ED [facility] again for further evaluation of syncopal episode. Patient was also noted to be hypoxic down to 87 on room air.  Chest x-ray showed cardiomegaly.  CT angio negative for PE, showed mild diffuse groundglass opacities suggest atelectasis or mild edema.    Assessment & Plan:   Principal Problem:   Acute respiratory failure with hypoxia and hypercapnia (HCC) Active Problems:   Syncope and collapse   Bradycardia   Cardiomegaly   Morbid obesity with BMI of 70 and over, adult (HCC)   Lymphedema   Debility   Anxiety and depression   Syncope  1-Acute hypoxic hypercapnic respiratory failure in the setting of obesity hypoventilation syndrome, acute on chronic diastolic heart failure exacerbation: -Change IV lasix to oral 8/15. Continue with lasix 40 mg BID>  -He will need BiPAP at discharge. -BiPAP at bedtime while in the hospital. -Negative 20 L. Renal function stable.  -Monitor renal function.  -Continue with  Guaifenesin and Nebulizer.  -Weight 604--554. Improved.  He has been using BIPAP at HS.   2-Syncope: Likely Vasovagal.  CTA negative for PE. ECHO no aortic valve stenosis.   Lymphedema, chronic: Continue with wound care  Elevated D dimer;  CT negative for PE>  [place] negative for DVT.   Anxiety, Depression:  Continue with Zoloft and Mirtazapine.   Morbid obesity  BMI 83.  Needs lives style modification.   Shoulder, knee pain. Continue with tylenol schedule for few days, Voltaren gel ordered.   Axilla intertriginous  Dermatitis; started  Nystatin. Improved.      Estimated body mass index is 77.67 kg/m as calculated from the following:   Height as of 07/25/21: 6\' 2"  (1.88 m).   Weight as of this encounter: 274.4 kg.   DVT prophylaxis: Heparin Code Status: Full code Family Communication: Family 8/15 sister who was at bedside.  Disposition Plan:  Status is: Inpatient  Remains inpatient appropriate because:IV treatments appropriate due to intensity of illness or inability to take PO  Dispo: The patient is from: Home              Anticipated d/c is to:  CIR versus SNF  rehab              Patient currently is medically stable to d/c. Awaiting Safe discharge plan.    Difficult to place patient No        Consultants:  Pulmonologist  Procedures:  Echo: Grade 2 diastolic dysfunction  Antimicrobials:    Subjective: He is feeling better, shoulder still hurts.  He is breathing better/    Objective: Vitals:   07/31/21 1604 07/31/21 1940 07/31/21 2008 07/31/21 2355  BP: (!) 146/68  (!) 154/55   Pulse: 71  74   Resp:   19   Temp:   97.8 F (36.6 C)   TempSrc:   Oral   SpO2:  90% 91% 97%  Weight:        Intake/Output Summary (Last 24 hours) at 08/01/2021 0834 Last data filed at 08/01/2021 0600 Gross per 24 hour  Intake --  Output 1900 ml  Net -1900 ml    Filed Weights   07/29/21 0355 07/30/21 0438  Weight: (!) 294.8 kg (!) 274.4 kg    Examination:  General exam: NAD Respiratory system: CTA Cardiovascular system: S 1, S 2 RRR Gastrointestinal system: BS present, soft, nt Central nervous system: Alert, follows command Extremities: LE with dressing.     Data Reviewed: I have personally reviewed following labs and imaging studies  CBC: Recent Labs  Lab 07/26/21 1414 07/26/21 1549 07/27/21 0400 07/30/21 0252  WBC 6.4  --  6.7 6.4  NEUTROABS 4.8  --   --   --   HGB 12.2* 15.3 11.4* 11.8*  HCT 40.7 45.0 37.7* 38.3*  MCV 89.8  --  90.2 88.9  PLT 203  --  178 187     Basic Metabolic Panel: Recent Labs  Lab 07/26/21 1414 07/26/21 1549 07/28/21 0243 07/29/21 0848 07/30/21 0252 07/31/21 0515  NA 137 141 138 138 138 139  K 4.2 4.0 3.9 4.0 3.8 3.8  CL 101  --  98 96* 96* 96*  CO2 28  --  34* 28 33* 37*  GLUCOSE 105*  --  113* 105* 113* 123*  BUN 16  --  14 13 17 18   CREATININE 1.19  --  1.25* 1.14 1.25* 1.25*  CALCIUM 8.1*  --  8.0* 8.2* 8.4* 8.3*    GFR: Estimated Creatinine Clearance: 153.8 mL/min (A) (by C-G formula based on SCr of 1.25 mg/dL (H)). Liver Function Tests: Recent Labs  Lab 07/26/21 1414  AST 20  ALT 14  ALKPHOS 96  BILITOT 1.1  PROT 6.8  ALBUMIN 2.9*    No results for input(s): LIPASE, AMYLASE in the last 168 hours. No results for input(s): AMMONIA in the last 168 hours. Coagulation Profile: Recent Labs  Lab 07/26/21 1600  INR 1.3*    Cardiac Enzymes: No results for input(s): CKTOTAL, CKMB, CKMBINDEX, TROPONINI in the last 168 hours. BNP (last 3 results) No results for input(s): PROBNP in the last 8760 hours. HbA1C: No results for input(s): HGBA1C in the last 72 hours. CBG: No results for input(s): GLUCAP in the last 168 hours. Lipid Profile: No results for input(s): CHOL, HDL, LDLCALC, TRIG, CHOLHDL, LDLDIRECT in the last 72 hours. Thyroid Function Tests: No results for input(s): TSH, T4TOTAL, FREET4, T3FREE, THYROIDAB in the last 72 hours.  Anemia Panel: No results for input(s): VITAMINB12, FOLATE, FERRITIN, TIBC, IRON, RETICCTPCT in the last 72 hours. Sepsis Labs: No results for input(s): PROCALCITON, LATICACIDVEN in the last 168 hours.  Recent Results (from the past 240 hour(s))  Resp Panel by RT-PCR (Flu A&B, Covid) Nasopharyngeal Swab     Status: None   Collection Time: 07/26/21  3:28 PM   Specimen: Nasopharyngeal Swab; Nasopharyngeal(NP) swabs in vial transport medium  Result Value Ref Range Status   SARS Coronavirus 2 by RT PCR NEGATIVE NEGATIVE Final    Comment: (NOTE) SARS-CoV-2 target  nucleic acids are NOT DETECTED.  The SARS-CoV-2 RNA is generally detectable in upper respiratory specimens during the acute phase of infection. The lowest concentration of SARS-CoV-2 viral copies this assay can detect is 138 copies/mL. A negative result does not preclude SARS-Cov-2 infection and should not be used as the sole basis for treatment or other patient management decisions. A negative result may occur with  improper specimen collection/handling, submission of specimen other than nasopharyngeal swab, presence of viral mutation(s) within the areas targeted by this assay, and inadequate number of viral copies(<138 copies/mL).  A negative result must be combined with clinical observations, patient history, and epidemiological information. The expected result is Negative.  Fact Sheet for Patients:  BloggerCourse.com  Fact Sheet for Healthcare Providers:  SeriousBroker.it  This test is no t yet approved or cleared by the [place] FDA and  has been authorized for detection and/or diagnosis of SARS-CoV-2 by FDA under an Emergency Use Authorization (EUA). This EUA will remain  in effect (meaning this test can be used) for the duration of the COVID-19 declaration under Section 564(b)(1) of the Act, 21 U.S.C.section 360bbb-3(b)(1), unless the authorization is terminated  or revoked sooner.       Influenza A by PCR NEGATIVE NEGATIVE Final   Influenza B by PCR NEGATIVE NEGATIVE Final    Comment: (NOTE) The Xpert Xpress SARS-CoV-2/FLU/RSV plus assay is intended as an aid in the diagnosis of influenza from Nasopharyngeal swab specimens and should not be used as a sole basis for treatment. Nasal washings and aspirates are unacceptable for Xpert Xpress SARS-CoV-2/FLU/RSV testing.  Fact Sheet for Patients: BloggerCourse.com  Fact Sheet for Healthcare  Providers: SeriousBroker.it  This test is not yet approved or cleared by the [place] FDA and has been authorized for detection and/or diagnosis of SARS-CoV-2 by FDA under an Emergency Use Authorization (EUA). This EUA will remain in effect (meaning this test can be used) for the duration of the COVID-19 declaration under Section 564(b)(1) of the Act, 21 U.S.C. section 360bbb-3(b)(1), unless the authorization is terminated or revoked.  Performed at [facility], [address], [place], [place] [address]           Radiology Studies: No results found.      Scheduled Meds:  acetaminophen  500 mg Oral TID   diclofenac Sodium  2 g Topical QID   furosemide  40 mg Oral BID   guaiFENesin  600 mg Oral BID   heparin injection (subcutaneous)  5,000 Units Subcutaneous Q8H   ipratropium-albuterol  3 mL Nebulization TID   mirtazapine  15 mg Oral QHS   nystatin   Topical TID   sertraline  150 mg Oral Daily   sodium chloride flush  3 mL Intravenous Q12H   Continuous Infusions:   LOS: 5 days    Time spent: 35 minutes    Jendaya Gossett A Sergio Hobart, MD [facility]   If 7PM-7AM, please contact night-coverage [email or web address]  08/01/2021, 8:34 AM

## 2021-08-01 NOTE — Plan of Care (Addendum)
  RD consulted for nutrition education regarding weight loss.   Body mass index is 71.22 kg/m. Pt meets criteria for morbid obesity based on current BMI.  Pt unavailable at time of RD visit x2 attempts. RD provided "Weight Loss Tips" handout from the Academy of Nutrition and Dietetics in addition to MyPlate graphic in discharge summary. Handouts emphasize the importance of serving sizes and provide examples of correct portions of common foods. Provides examples of ways to balance meals/snacks and encourages intake of high-fiber, whole grain complex carbohydrates. Handouts also emphasize the importance of hydration with calorie-free beverages and limiting sugar-sweetened beverages. RD will follow-up as able to re-attempt education and answer pt's questions; however, weight loss is still not an ideal goal for an acute inpatient hospitalization. That being said, consult has been placed to [facility] to provide outpatient education and support for pt's weight loss endeavors.   Expect fair compliance.  Current diet order is heart healthy, patient is consuming approximately 100% of meals at this time. Labs and medications reviewed. No further nutrition interventions warranted at this time. RD will continue to follow-up as able to provide education. If additional nutrition issues arise, please re-consult RD.  Eugene Gavia, MS, RD, LDN (she/her/hers) RD pager number and weekend/on-call pager number located in Amion.

## 2021-08-02 DIAGNOSIS — R001 Bradycardia, unspecified: Secondary | ICD-10-CM | POA: Diagnosis not present

## 2021-08-02 DIAGNOSIS — F419 Anxiety disorder, unspecified: Secondary | ICD-10-CM | POA: Diagnosis not present

## 2021-08-02 DIAGNOSIS — I517 Cardiomegaly: Secondary | ICD-10-CM | POA: Diagnosis not present

## 2021-08-02 DIAGNOSIS — J9601 Acute respiratory failure with hypoxia: Secondary | ICD-10-CM | POA: Diagnosis not present

## 2021-08-02 LAB — BASIC METABOLIC PANEL
Anion gap: 8 (ref 5–15)
BUN: 21 mg/dL — ABNORMAL HIGH (ref 6–20)
CO2: 30 mmol/L (ref 22–32)
Calcium: 8.4 mg/dL — ABNORMAL LOW (ref 8.9–10.3)
Chloride: 99 mmol/L (ref 98–111)
Creatinine, Ser: 1.19 mg/dL (ref 0.61–1.24)
GFR, Estimated: >60 mL/min (ref >60)
Glucose, Bld: 116 mg/dL — ABNORMAL HIGH (ref 70–99)
Potassium: 3.8 mmol/L (ref 3.5–5.1)
Sodium: 137 mmol/L (ref 135–145)

## 2021-08-02 LAB — CBC
HCT: 37.8 % — ABNORMAL LOW (ref 39.0–52.0)
Hemoglobin: 11.5 g/dL — ABNORMAL LOW (ref 13.0–17.0)
MCH: 26.9 pg (ref 26.0–34.0)
MCHC: 30.4 g/dL (ref 30.0–36.0)
MCV: 88.5 fL (ref 80.0–100.0)
Platelets: 208 10*3/uL (ref 150–400)
RBC: 4.27 MIL/uL (ref 4.22–5.81)
RDW: 16.2 % — ABNORMAL HIGH (ref 11.5–15.5)
WBC: 6.4 10*3/uL (ref 4.0–10.5)
nRBC: 0 % (ref 0.0–0.2)

## 2021-08-02 MED ORDER — IPRATROPIUM-ALBUTEROL 0.5-2.5 (3) MG/3ML IN SOLN
3.0000 mL | Freq: Two times a day (BID) | RESPIRATORY_TRACT | Status: DC
  Administered 2021-08-02 – 2021-08-05 (×7): 3 mL via RESPIRATORY_TRACT
  Filled 2021-08-02 (×7): qty 3

## 2021-08-02 NOTE — Progress Notes (Signed)
Physical Therapy Treatment Patient Details Name: Hunter King MRN: [id number] DOB: 1966/07/05 Today's Date: 08/02/2021    History of Present Illness Pt is a 55yo male who presents to [facility] ED on 8/10 secondary to recent syncope on 8/9. Of note, was seen at [facility] ED 8/9 and evaluated for left knee pain but pt and family did not feel that syncopal episode was appropriately evaluated. Noted to be hypoxic. CTA of Chest showed mild diffuse ground-glass opacities suggestive of atelectasis or mild  edema. PMH: chronic lymphodema, DM, Morbid obesity, R knee surgery, bradycardia, anxiety, depression.    PT Comments    Patient continues to make excellent progress. He openly discusses his anxiety and responds well to encouragement and breathing exercises. He was able to stand from EOB with minguard to supervision assist (2 person for safety and confidence for pt). He progressed to pre-gait activities including wt-shifting, side-stepping, and marching (completing multiple sets of each). Discussed home set-up with regards to discharge planning and pt does have 5 steps to enter home. He left home via medical transport and has not climbed steps in several years. His family is looking into having a ramp built, however this is still only in the planning stages. Anticipate he will be able to progress to climbing steps within 2 week CIR stay.     Follow Up Recommendations  Other (comment);CIR (pending progression; if pt progresses well, may be able to d/c home with HHPT)     Equipment Recommendations  Other (comment);Hospital bed (Bariatric walker, bariatric BSC)    Recommendations for Other Services       Precautions / Restrictions Precautions Precautions: Fall Precaution Comments: OCD; agoraphobic; skin breakdown    Mobility  Bed Mobility Overal bed mobility: Needs Assistance Bed Mobility: Supine to Sit;Sit to Supine Rolling: +2 for physical assistance;Min assist   Supine to sit: +2 for  safety/equipment;Min guard (with rail) Sit to supine: +2 for safety/equipment;Supervision (with rail)   General bed mobility comments: heavy use of momentum, bed rails adn increased HOB. At baseline, pt gets up from prone position    Transfers Overall transfer level: Needs assistance Equipment used: None Transfers: Sit to/from Stand Sit to Stand: +2 safety/equipment;Min assist;Supervision         General transfer comment: 5 reps sit - stands, eventually only requiring S  Ambulation/Gait             General Gait Details: pre-gait activities along EOB while holding lightly to back of recliner (side-stepping each direction ~10 reps total) and marching; multiple reps of each activity   Stairs             Wheelchair Mobility    Modified Rankin (Stroke Patients Only)       Balance Overall balance assessment: Needs assistance Sitting-balance support: Bilateral upper extremity supported;Feet supported Sitting balance-Leahy Scale: Good Sitting balance - Comments: able to sit EOB without PT or OT support   Standing balance support: Bilateral upper extremity supported;During functional activity Standing balance-Leahy Scale: Poor Standing balance comment: reliant on external assist, no evidence of LE buckling                            Cognition Arousal/Alertness: Awake/alert Behavior During Therapy: Anxious Overall Cognitive Status: Within Functional Limits for tasks assessed  General Comments: Pt discussed his issues with agoraphobia - states this has gone on for years but became worse with Covid isolation; is able to walk onto his front porch, but this makes him "physically ill" "I know it's in my head"      Exercises General Exercises - Lower Extremity Ankle Circles/Pumps: AROM;Both;10 reps;Supine Heel Slides: AROM;Both;10 reps;Supine Hip Flexion/Marching: AROM;Both;20 reps;Standing Other  Exercises Other Exercises: Issued level 3 theraband to complete BUE strengthening ex at bed level    General Comments General comments (skin integrity, edema, etc.): BP prior to activity 135/51; sitting EOB 128/72; to reduce anxiety, did not further monitor BP and monitored symptoms only; pt admits to feeling anxious throughout session; lots of cues for breathing to help control anxiety      Pertinent Vitals/Pain Pain Assessment: Faces Faces Pain Scale: Hurts little more Pain Location: BLE wtih weight bearing Pain Descriptors / Indicators: Grimacing;Discomfort Pain Intervention(s): Limited activity within patient's tolerance;Monitored during session    Home Living                      Prior Function            PT Goals (current goals can now be found in the care plan section) Acute Rehab PT Goals Patient Stated Goal: to do some rehab before going home; to not fall Time For Goal Achievement: 08/10/21 Potential to Achieve Goals: Fair Progress towards PT goals: Progressing toward goals    Frequency    Min 2X/week      PT Plan Current plan remains appropriate    Co-evaluation PT/OT/SLP Co-Evaluation/Treatment: Yes Reason for Co-Treatment: Complexity of the patient's impairments (multi-system involvement);For patient/therapist safety;To address functional/ADL transfers PT goals addressed during session: Mobility/safety with mobility;Balance;Strengthening/ROM OT goals addressed during session: ADL's and self-care      AM-PAC PT "6 Clicks" Mobility   Outcome Measure  Help needed turning from your back to your side while in a flat bed without using bedrails?: A Little Help needed moving from lying on your back to sitting on the side of a flat bed without using bedrails?: Total Help needed moving to and from a bed to a chair (including a wheelchair)?: Total Help needed standing up from a chair using your arms (e.g., wheelchair or bedside chair)?: Total Help needed  to walk in hospital room?: Total Help needed climbing 3-5 steps with a railing? : Total 6 Click Score: 8    End of Session Equipment Utilized During Treatment: Oxygen (2L via [place]) Activity Tolerance: Patient tolerated treatment well Patient left: in bed;with call bell/phone within reach (on bariatric bed in ED) Nurse Communication: Mobility status PT Visit Diagnosis: History of falling (Z91.81);Muscle weakness (generalized) (M62.81);Difficulty in walking, not elsewhere classified (R26.2)     Time: 2993-7169 PT Time Calculation (min) (ACUTE ONLY): 55 min  Charges:  $Gait Training: 23-37 mins                      Jerolyn Center, PT Pager [phone]    Zena Amos 08/02/2021, 11:48 AM

## 2021-08-02 NOTE — Progress Notes (Signed)
Inpatient Rehab Admissions Coordinator:   Met with patient at bedside to discuss CIR recommendations.  Pt is highly motivated.  We discussed average length of stay to be about 2 weeks (dependent upon progress), and goals of independence at home.  Pt lives with his wife and 53 yr old son, and has a 74 y/o son in [place].  I reviewed insurance process and need for prior authorization, which I will start today.    Shann Medal, PT, DPT Admissions Coordinator [phone] 08/02/21  1:03 PM

## 2021-08-02 NOTE — Plan of Care (Signed)

## 2021-08-02 NOTE — Progress Notes (Signed)
PROGRESS NOTE  Hunter King WPY:[id number] DOB: 08-04-1966 DOA: 07/26/2021 PCP: Avis Epley, PA-C   LOS: 6 days   Brief Narrative / Interim history: 55 year old with past medical history significant for anxiety, depression, lymphedema, morbid obesity presents after having a fall at home.  Patient fell at home and passed out.  That time patient was evaluated at [facility] they check x-ray of his knee and he was discharged home. He presented to the ED [facility] again for further evaluation of syncopal episode. Patient was also noted to be hypoxic down to 87 on room air.  Chest x-ray showed cardiomegaly.  CT angio negative for PE, showed mild diffuse groundglass opacities suggest atelectasis or mild edema.  Subjective / 24h Interval events: Doing well, overall feeling better  Assessment & Plan: Principal Problem Acute hypoxic hypercapnic respiratory failure in the setting of obesity hypoventilation syndrome, acute on chronic diastolic heart failure exacerbation -initially diuresed with IV Lasix, now converted to po. Net negative 24L -He will need BiPAP at discharge. -BiPAP at bedtime while in the hospital. -Monitor renal function -Weight 604 >> 552  Active Problems 2-Syncope: Likely Vasovagal.  CTA negative for PE. ECHO no aortic valve stenosis.    Lymphedema -chronic: Continue with wound care   Elevated D dimer -CT negative for PE -[place] negative for DVT.    Anxiety, Depression -Continue with Zoloft and Mirtazapine.    Obesity, class III  -Needs life style modification. BMI 70   Shoulder, knee pain-Continue with tylenol schedule for few days, Voltaren gel ordered.    Axilla intertriginous Dermatitis; started  Nystatin. Improved.   Scheduled Meds:  acetaminophen  500 mg Oral TID   diclofenac Sodium  2 g Topical QID   furosemide  40 mg Oral BID   guaiFENesin  600 mg Oral BID   heparin injection (subcutaneous)  5,000 Units Subcutaneous Q8H    ipratropium-albuterol  3 mL Nebulization BID   mirtazapine  15 mg Oral QHS   nystatin   Topical TID   sertraline  150 mg Oral Daily   sodium chloride flush  3 mL Intravenous Q12H   Continuous Infusions: PRN Meds:.acetaminophen **OR** acetaminophen, albuterol, loratadine, ondansetron **OR** ondansetron (ZOFRAN) IV, traMADol  Diet Orders (From admission, onward)     Start     Ordered   07/26/21 1725  Diet Heart Room service appropriate? Yes; Fluid consistency: Thin  Diet effective now       Question Answer Comment  Room service appropriate? Yes   Fluid consistency: Thin      07/26/21 1726            DVT prophylaxis: heparin injection 5,000 Units Start: 07/27/21 1400     Code Status: Full Code  Family Communication: no family at bedside  Status is: Inpatient  Remains inpatient appropriate because:Unsafe d/c plan and Inpatient level of care appropriate due to severity of illness  Dispo: The patient is from: Home              Anticipated d/c is to: SNF              Patient currently is not medically stable to d/c.   Difficult to place patient No  Level of care: Telemetry Medical  Consultants:  none  Procedures:  2D echo:   1. Left ventricular ejection fraction, by estimation, is 65 to 70%. The left ventricle has normal function. The left ventricle has no regional wall motion abnormalities. There is moderate left ventricular hypertrophy.  Left ventricular diastolic parameters are consistent with Grade II diastolic dysfunction (pseudonormalization). Elevated left ventricular end-diastolic pressure. The E/e' is 15.   2. Right ventricular systolic function is normal. The right ventricular size is mildly enlarged. There is mildly elevated pulmonary artery systolic pressure. The estimated right ventricular systolic pressure is 44.7 mmHg.   3. The mitral valve is grossly normal. No evidence of mitral valve regurgitation.   4. The aortic valve is tricuspid. Aortic valve  regurgitation is not visualized.   5. The inferior vena cava is dilated in size with >50% respiratory variability, suggesting right atrial pressure of 8 mmHg.   Microbiology  none  Antimicrobials: none    Objective: Vitals:   08/01/21 2157 08/01/21 2340 08/02/21 0532 08/02/21 0731  BP: (!) 116/54     Pulse: 78   70  Resp: 18   18  Temp: 97.9 F (36.6 C)     TempSrc: Oral     SpO2: 95% 95%  92%  Weight:   (!) 250.5 kg     Intake/Output Summary (Last 24 hours) at 08/02/2021 1145 Last data filed at 08/02/2021 9528 Gross per 24 hour  Intake --  Output 4400 ml  Net -4400 ml   Filed Weights   07/30/21 0438 08/01/21 0900 08/02/21 0532  Weight: (!) 274.4 kg (!) 251.6 kg (!) 250.5 kg    Examination:  Constitutional: NAD Eyes: no scleral icterus ENMT: Mucous membranes are moist.  Neck: normal, supple Respiratory: clear to auscultation bilaterally, no wheezing, no crackles. Normal respiratory effort.  Cardiovascular: Regular rate and rhythm, no murmurs / rubs / gallops. Chronic venous stasis changes bilateral LE Abdomen: non distended, no tenderness. Bowel sounds positive.  Musculoskeletal: no clubbing / cyanosis.  Skin: LE lymphedema Neurologic: non focal    Data Reviewed: I have independently reviewed following labs and imaging studies  CBC: Recent Labs  Lab 07/26/21 1414 07/26/21 1549 07/27/21 0400 07/30/21 0252 08/02/21 0301  WBC 6.4  --  6.7 6.4 6.4  NEUTROABS 4.8  --   --   --   --   HGB 12.2* 15.3 11.4* 11.8* 11.5*  HCT 40.7 45.0 37.7* 38.3* 37.8*  MCV 89.8  --  90.2 88.9 88.5  PLT 203  --  178 187 208   Basic Metabolic Panel: Recent Labs  Lab 07/28/21 0243 07/29/21 0848 07/30/21 0252 07/31/21 0515 08/02/21 0301  NA 138 138 138 139 137  K 3.9 4.0 3.8 3.8 3.8  CL 98 96* 96* 96* 99  CO2 34* 28 33* 37* 30  GLUCOSE 113* 105* 113* 123* 116*  BUN 14 13 17 18  21*  CREATININE 1.25* 1.14 1.25* 1.25* 1.19  CALCIUM 8.0* 8.2* 8.4* 8.3* 8.4*   Liver  Function Tests: Recent Labs  Lab 07/26/21 1414  AST 20  ALT 14  ALKPHOS 96  BILITOT 1.1  PROT 6.8  ALBUMIN 2.9*   Coagulation Profile: Recent Labs  Lab 07/26/21 1600  INR 1.3*   HbA1C: No results for input(s): HGBA1C in the last 72 hours. CBG: No results for input(s): GLUCAP in the last 168 hours.  Recent Results (from the past 240 hour(s))  Resp Panel by RT-PCR (Flu A&B, Covid) Nasopharyngeal Swab     Status: None   Collection Time: 07/26/21  3:28 PM   Specimen: Nasopharyngeal Swab; Nasopharyngeal(NP) swabs in vial transport medium  Result Value Ref Range Status   SARS Coronavirus 2 by RT PCR NEGATIVE NEGATIVE Final    Comment: (NOTE) SARS-CoV-2 target nucleic acids  are NOT DETECTED.  The SARS-CoV-2 RNA is generally detectable in upper respiratory specimens during the acute phase of infection. The lowest concentration of SARS-CoV-2 viral copies this assay can detect is 138 copies/mL. A negative result does not preclude SARS-Cov-2 infection and should not be used as the sole basis for treatment or other patient management decisions. A negative result may occur with  improper specimen collection/handling, submission of specimen other than nasopharyngeal swab, presence of viral mutation(s) within the areas targeted by this assay, and inadequate number of viral copies(<138 copies/mL). A negative result must be combined with clinical observations, patient history, and epidemiological information. The expected result is Negative.  Fact Sheet for Patients:  BloggerCourse.com  Fact Sheet for Healthcare Providers:  SeriousBroker.it  This test is no t yet approved or cleared by the [place] FDA and  has been authorized for detection and/or diagnosis of SARS-CoV-2 by FDA under an Emergency Use Authorization (EUA). This EUA will remain  in effect (meaning this test can be used) for the duration of the COVID-19  declaration under Section 564(b)(1) of the Act, 21 U.S.C.section 360bbb-3(b)(1), unless the authorization is terminated  or revoked sooner.       Influenza A by PCR NEGATIVE NEGATIVE Final   Influenza B by PCR NEGATIVE NEGATIVE Final    Comment: (NOTE) The Xpert Xpress SARS-CoV-2/FLU/RSV plus assay is intended as an aid in the diagnosis of influenza from Nasopharyngeal swab specimens and should not be used as a sole basis for treatment. Nasal washings and aspirates are unacceptable for Xpert Xpress SARS-CoV-2/FLU/RSV testing.  Fact Sheet for Patients: BloggerCourse.com  Fact Sheet for Healthcare Providers: SeriousBroker.it  This test is not yet approved or cleared by the [place] FDA and has been authorized for detection and/or diagnosis of SARS-CoV-2 by FDA under an Emergency Use Authorization (EUA). This EUA will remain in effect (meaning this test can be used) for the duration of the COVID-19 declaration under Section 564(b)(1) of the Act, 21 U.S.C. section 360bbb-3(b)(1), unless the authorization is terminated or revoked.  Performed at [facility], [address], [place], [place] [address]      Radiology Studies: No results found.   Pamella Pert, MD, PhD [facility]  Between 7 am - 7 pm I am available, please contact me via Amion (for emergencies) or Securechat (non urgent messages)  Between 7 pm - 7 am I am not available, please contact night coverage MD/APP via Amion

## 2021-08-02 NOTE — Progress Notes (Signed)
Occupational Therapy Treatment Patient Details Name: Hunter King MRN: [id number] DOB: [birth year] Today's Date: 08/02/2021    History of present illness Pt is a 55yo male who presents to [facility] ED on 8/10 secondary to recent syncope on 8/9. Of note, was seen at [facility] ED 8/9 and evaluated for left knee pain but pt and family did not feel that syncopal episode was appropriately evaluated. Noted to be hypoxic. CTA of Chest showed mild diffuse ground-glass opacities suggestive of atelectasis or mild  edema. PMH: chronic lymphodema, DM, Morbid obesity, R knee surgery, bradycardia, anxiety, depression.   OT comments  Excellent session today. Able to progress to EOB and stand multiple times with +2 min/minguard A using back of recliner to support self while side stepping and marching in place. VSS on 2L throughout session with Max HR 109. Pt anxious initially about standing, however anxiety reduced as pt saw he progress. Asked staff to order bariatric BSC to progress OOB for toileting. Began education regarding AE to use for ADL.  Pt with Intertriginous Dermitis and has order for Interdry to use in skin folds - asked NT to apply after helping pt with bath. Continue to recommend CIR for aggressive rehab to facilitate safe DC home.  Pt became emotional at end of session stating that he knows he has to make lifestyle changes in order to live a healthier life. States he is embarrased about the situation he is in.  Pt very appreciative. Will continue to follow acutely.  Follow Up Recommendations  CIR    Equipment Recommendations  3 in 1 bedside commode (bariatric)    Recommendations for Other Services Rehab consult    Precautions / Restrictions Precautions Precautions: Fall Precaution Comments: OCD; agoraphobic; skin breakdown       Mobility Bed Mobility Overal bed mobility: Needs Assistance Bed Mobility: Supine to Sit;Sit to Supine     Supine to sit: +2 for safety/equipment;Min  guard Sit to supine: +2 for safety/equipment;Supervision   General bed mobility comments: heavy use of momentum, bed rails adn increased HOB. At baseline, pt gets up from prone position    Transfers Overall transfer level: Needs assistance   Transfers: Sit to/from Stand Sit to Stand: +2 safety/equipment;Min assist         General transfer comment: Multiple sit - stands, eventually only requiring S    Balance     Sitting balance-Leahy Scale: Good       Standing balance-Leahy Scale: Poor                             ADL either performed or assessed with clinical judgement   ADL Overall ADL's : Needs assistance/impaired                                     Functional mobility during ADLs: Minimal assistance;+2 for safety/equipment General ADL Comments: Pt issued reacher and long handled sponge to work on [employer]; began Engineer, manufacturing. also educated pt on use of toilet aid - will need longer 15 in toilet aid, Has previously been using a plunger and takes @ 1 hour for his routine of pericare; assisted pt with bathign his back. Skin folds on back cleansed and dried thouroughly. Powder placed. Fron folds semi cleaned and pillow cases placed in folds  - plan is for pt to have a bath with nursing. Orders  for Interdry since admission     Vision       Perception     Praxis      Cognition Arousal/Alertness: Awake/alert Behavior During Therapy: Anxious Overall Cognitive Status: Within Functional Limits for tasks assessed                                 General Comments: Pt discussed his issues with agoraphobia - states this has gone on for years but became worse with Covid isolation; is able to walk onto his front porch, but this makes him "physically ill" "I know it's in my head"        Exercises Other Exercises Other Exercises: Issued level 3 theraband to complete BUE strengthening ex at bed level   Shoulder  Instructions       General Comments Able to side step 3 steps R/L on 4 trials; Able to march in place 15-25 steps 2 trials    Pertinent Vitals/ Pain       Pain Assessment: Faces Faces Pain Scale: Hurts little more Pain Location: BLE wtih weight bearing Pain Descriptors / Indicators: Grimacing;Discomfort Pain Intervention(s): Limited activity within patient's tolerance  Home Living                                          Prior Functioning/Environment              Frequency  Min 2X/week        Progress Toward Goals  OT Goals(current goals can now be found in the care plan section)  Progress towards OT goals: Progressing toward goals  Acute Rehab OT Goals Patient Stated Goal: to do some rehab before going home; to not fall OT Goal Formulation: With patient Time For Goal Achievement: 08/10/21 Potential to Achieve Goals: Good ADL Goals Pt Will Perform Lower Body Bathing: with adaptive equipment;with supervision;with set-up Pt Will Perform Lower Body Dressing: with supervision;with set-up;with adaptive equipment;sit to/from stand Pt Will Transfer to Toilet: with modified independence;ambulating Pt Will Perform Toileting - Clothing Manipulation and hygiene: with modified independence;sit to/from stand;sitting/lateral leans;with adaptive equipment Pt/caregiver will Perform Home Exercise Program: Increased strength;Independently;With theraband;With written HEP provided Additional ADL Goal #1: Pt will verbalize 2 strateiges to manage MASD in skin folds and assist with care  Plan Discharge plan remains appropriate    Co-evaluation    PT/OT/SLP Co-Evaluation/Treatment: Yes Reason for Co-Treatment: For patient/therapist safety   OT goals addressed during session: ADL's and self-care      AM-PAC OT "6 Clicks" Daily Activity     Outcome Measure   Help from another person eating meals?: None Help from another person taking care of personal grooming?: A  Little Help from another person toileting, which includes using toliet, bedpan, or urinal?: A Lot Help from another person bathing (including washing, rinsing, drying)?: A Lot Help from another person to put on and taking off regular upper body clothing?: A Little Help from another person to put on and taking off regular lower body clothing?: A Lot 6 Click Score: 16    End of Session Equipment Utilized During Treatment: Oxygen (2L)  OT Visit Diagnosis: Other abnormalities of gait and mobility (R26.89);Muscle weakness (generalized) (M62.81);History of falling (Z91.81);Pain Pain - Right/Left: Left Pain - part of body: Shoulder;Knee   Activity Tolerance Patient tolerated treatment well  Patient Left in bed;with call bell/phone within reach   Nurse Communication Mobility status        Time: 0900-1007 OT Time Calculation (min): 67 min  Charges: OT General Charges $OT Visit: 1 Visit OT Treatments $Self Care/Home Management : 23-37 mins  [name], OT/L   Acute OT Clinical Specialist Acute Rehabilitation Services Pager [phone] Office [phone]    [facility] 08/02/2021, 10:38 AM

## 2021-08-02 NOTE — TOC Progression Note (Signed)
Transition of Care [facility]) - Progression Note    Patient Details  Name: Hunter King MRN: [id number] Date of Birth: 1966/07/29  Transition of Care [facility]) CM/SW Contact  Lorri Frederick, LCSW Phone Number: 08/02/2021, 1:28 PM  Clinical Narrative:   CSW continues to follow for DC needs.  CIR notes they will initiate insurance auth.  Pt is above weight limits for SNF placement.      Expected Discharge Plan: IP Rehab Facility Barriers to Discharge: Other (must enter comment) (per CIR, not yet candidate based on how much assistance he is requiring)  Expected Discharge Plan and Services Expected Discharge Plan: IP Rehab Facility     Post Acute Care Choice: IP Rehab Living arrangements for the past 2 months: Single Family Home                                       Social Determinants of Health (SDOH) Interventions    Readmission Risk Interventions No flowsheet data found.

## 2021-08-03 DIAGNOSIS — I517 Cardiomegaly: Secondary | ICD-10-CM | POA: Diagnosis not present

## 2021-08-03 DIAGNOSIS — R001 Bradycardia, unspecified: Secondary | ICD-10-CM | POA: Diagnosis not present

## 2021-08-03 DIAGNOSIS — J9601 Acute respiratory failure with hypoxia: Secondary | ICD-10-CM | POA: Diagnosis not present

## 2021-08-03 DIAGNOSIS — F419 Anxiety disorder, unspecified: Secondary | ICD-10-CM | POA: Diagnosis not present

## 2021-08-03 NOTE — Progress Notes (Signed)
Physical Therapy Treatment Patient Details Name: Hunter King MRN: [id number] DOB: [birth year] Today's Date: 08/03/2021    History of Present Illness Pt is a 55yo male who presents to [facility] ED on 8/10 secondary to recent syncope on 8/9. Of note, was seen at [facility] ED 8/9 and evaluated for left knee pain but pt and family did not feel that syncopal episode was appropriately evaluated. Noted to be hypoxic. CTA of Chest showed mild diffuse ground-glass opacities suggestive of atelectasis or mild  edema. PMH: chronic lymphodema, DM, Morbid obesity, R knee surgery, bradycardia, anxiety, depression.    PT Comments    Patient remains highly motivated and willing to do anything we ask of him. Patient had his wife bring in his shoes and used these during session. Able to progress to standing with supervision (still using back of recliner for safety) and walking 5 ft forward and 5 ft backward (pushing recliner due to feet not able to fit inside bariatric RW). Seated rest and repeated. Continues to require 2L O2 during activity.     Follow Up Recommendations  CIR (pending progression; if pt progresses well, may be able to d/c home with HHPT)     Equipment Recommendations  Other (comment) (Bariatric walker, bariatric BSC)    Recommendations for Other Services       Precautions / Restrictions Precautions Precautions: Fall Precaution Comments: OCD; agoraphobic; skin breakdown    Mobility  Bed Mobility Overal bed mobility: Needs Assistance Bed Mobility: Supine to Sit;Sit to Supine Rolling: Supervision   Supine to sit: +2 for safety/equipment;Supervision;HOB elevated (with rail) Sit to supine: Supervision (with rail)   General bed mobility comments: heavy use of momentum, bed rails adn increased HOB. At baseline, pt gets up from prone position    Transfers Overall transfer level: Needs assistance Equipment used:  (back of regular recliner) Transfers: Sit to/from Stand Sit to Stand: +2  safety/equipment;Supervision         General transfer comment: 4 reps sit - stands,  Ambulation/Gait Ambulation/Gait assistance: Min guard;+2 safety/equipment Gait Distance (Feet): 10 Feet (5 ft forward, 5 ft backward x 2 reps) Assistive device:  (holding back of recliner (pushing recliner) because unable to get his feet inside bariatric walker) Gait Pattern/deviations: Step-through pattern;Decreased stride length     General Gait Details: pre-gait activities along EOB while holding lightly to back of recliner (side-stepping each direction and marching); multiple reps of each activity   Stairs             Wheelchair Mobility    Modified Rankin (Stroke Patients Only)       Balance Overall balance assessment: Needs assistance Sitting-balance support: Bilateral upper extremity supported;Feet supported Sitting balance-Leahy Scale: Good Sitting balance - Comments: able to sit EOB without PT or OT support   Standing balance support: During functional activity;No upper extremity supported Standing balance-Leahy Scale: Fair                              Cognition Arousal/Alertness: Awake/alert Behavior During Therapy: Anxious Overall Cognitive Status: Within Functional Limits for tasks assessed                                        Exercises      General Comments General comments (skin integrity, edema, etc.): Wife present. Pt on 2L O2 with sats 88-90%  with activity. Pt reports he was far more SOB at home than he has been while hospitalized      Pertinent Vitals/Pain Pain Assessment: Faces Faces Pain Scale: Hurts a little bit Pain Location: LLE below knee Pain Descriptors / Indicators: Discomfort Pain Intervention(s): Limited activity within patient's tolerance;Monitored during session    Home Living                      Prior Function            PT Goals (current goals can now be found in the care plan section) Acute  Rehab PT Goals Patient Stated Goal: to do some rehab before going home; to not fall PT Goal Formulation: With patient Time For Goal Achievement: 08/17/21 Potential to Achieve Goals: Good Progress towards PT goals: Goals met and updated - see care plan    Frequency    Min 3X/week      PT Plan Current plan remains appropriate    Co-evaluation              AM-PAC PT "6 Clicks" Mobility   Outcome Measure  Help needed turning from your back to your side while in a flat bed without using bedrails?: A Little Help needed moving from lying on your back to sitting on the side of a flat bed without using bedrails?: Total Help needed moving to and from a bed to a chair (including a wheelchair)?: Total Help needed standing up from a chair using your arms (e.g., wheelchair or bedside chair)?: Total Help needed to walk in hospital room?: Total Help needed climbing 3-5 steps with a railing? : Total 6 Click Score: 8    End of Session Equipment Utilized During Treatment: Oxygen (2L via [place]) Activity Tolerance: Patient tolerated treatment well Patient left: in bed;with call bell/phone within reach Nurse Communication: Mobility status PT Visit Diagnosis: History of falling (Z91.81);Muscle weakness (generalized) (M62.81);Difficulty in walking, not elsewhere classified (R26.2)     Time: 4860-1947 PT Time Calculation (min) (ACUTE ONLY): 40 min  Charges:  $Gait Training: 23-37 mins $Therapeutic Activity: 8-22 mins                      Arby Barrette, PT Pager [phone]    Rexanne Mano 08/03/2021, 4:10 PM

## 2021-08-03 NOTE — Progress Notes (Addendum)
PROGRESS NOTE  Hunter King IEP:[id number] DOB: Jun 20, 1966 DOA: 07/26/2021 PCP: Avis Epley, PA-C   LOS: 7 days   Brief Narrative / Interim history: 55 year old with past medical history significant for anxiety, depression, lymphedema, morbid obesity presents after having a fall at home.  Patient fell at home and passed out.  That time patient was evaluated at [facility] they check x-ray of his knee and he was discharged home. He presented to the ED [facility] again for further evaluation of syncopal episode. Patient was also noted to be hypoxic down to 87 on room air.  Chest x-ray showed cardiomegaly.  CT angio negative for PE, showed mild diffuse groundglass opacities suggest atelectasis or mild edema.  Subjective / 24h Interval events: Doing well, overall feeling better  Assessment & Plan: Principal Problem Acute on chronic hypoxic hypercapnic respiratory failure in the setting of obesity hypoventilation syndrome, acute on chronic diastolic heart failure exacerbation as well as COPD with severe obstructive disease -initially diuresed with IV Lasix, now converted to po.  Net -27 L -He will need BiPAP at discharge. -BiPAP at bedtime while in the hospital. -Spirometry done during his hospital stay showed severe obstructive disease -Monitor renal function -Weight 604 >> 552 this morning  Active Problems Syncope: Likely Vasovagal.  CTA negative for PE. ECHO no aortic valve stenosis.    Lymphedema -chronic: Continue with wound care   Elevated D dimer -CT negative for PE -[place] negative for DVT.    Anxiety, Depression -Continue with Zoloft and Mirtazapine.    Obesity, class III  -Needs life style modification. BMI 70   Shoulder, knee pain-Continue with tylenol schedule for few days, Voltaren gel ordered.    Axilla intertriginous Dermatitis; started  Nystatin. Improved.   Scheduled Meds:  diclofenac Sodium  2 g Topical QID   furosemide  40 mg Oral BID    guaiFENesin  600 mg Oral BID   heparin injection (subcutaneous)  5,000 Units Subcutaneous Q8H   ipratropium-albuterol  3 mL Nebulization BID   mirtazapine  15 mg Oral QHS   nystatin   Topical TID   sertraline  150 mg Oral Daily   sodium chloride flush  3 mL Intravenous Q12H   Continuous Infusions: PRN Meds:.acetaminophen **OR** acetaminophen, albuterol, loratadine, ondansetron **OR** ondansetron (ZOFRAN) IV, traMADol  Diet Orders (From admission, onward)     Start     Ordered   07/26/21 1725  Diet Heart Room service appropriate? Yes; Fluid consistency: Thin  Diet effective now       Question Answer Comment  Room service appropriate? Yes   Fluid consistency: Thin      07/26/21 1726            DVT prophylaxis: heparin injection 5,000 Units Start: 07/27/21 1400     Code Status: Full Code  Family Communication: no family at bedside  Status is: Inpatient  Remains inpatient appropriate because:Unsafe d/c plan and Inpatient level of care appropriate due to severity of illness  Dispo: The patient is from: Home              Anticipated d/c is to: SNF              Patient currently is medically stable to d/c.   Difficult to place patient No  Level of care: Telemetry Medical  Consultants:  none  Procedures:  2D echo:   1. Left ventricular ejection fraction, by estimation, is 65 to 70%. The left ventricle has normal function. The  left ventricle has no regional wall motion abnormalities. There is moderate left ventricular hypertrophy. Left ventricular diastolic parameters are consistent with Grade II diastolic dysfunction (pseudonormalization). Elevated left ventricular end-diastolic pressure. The E/e' is 15.   2. Right ventricular systolic function is normal. The right ventricular size is mildly enlarged. There is mildly elevated pulmonary artery systolic pressure. The estimated right ventricular systolic pressure is 44.7 mmHg.   3. The mitral valve is grossly normal. No  evidence of mitral valve regurgitation.   4. The aortic valve is tricuspid. Aortic valve regurgitation is not visualized.   5. The inferior vena cava is dilated in size with >50% respiratory variability, suggesting right atrial pressure of 8 mmHg.   Microbiology  none  Antimicrobials: none    Objective: Vitals:   08/02/21 2021 08/02/21 2042 08/03/21 0555 08/03/21 0739  BP: 132/68  (!) 141/74   Pulse: 63  62 78  Resp: 19  (!) 22 20  Temp: 98.1 F (36.7 C)  97.7 F (36.5 C)   TempSrc:      SpO2: 95% 97% 97% 93%  Weight:        Intake/Output Summary (Last 24 hours) at 08/03/2021 1218 Last data filed at 08/03/2021 0836 Gross per 24 hour  Intake --  Output 3550 ml  Net -3550 ml    Filed Weights   07/30/21 0438 08/01/21 0900 08/02/21 0532  Weight: (!) 274.4 kg (!) 251.6 kg (!) 250.5 kg    Examination:  Constitutional: No distress, in bed Eyes: No icterus ENMT: mmm Neck: normal, supple Respiratory: Clear bilaterally, no wheezing or crackles, distant breath sounds due to body habitus Cardiovascular: Regular rate and rhythm, no murmurs, chronic venous stasis bilateral lower extremities Abdomen: Soft, NT, ND, bowel sounds positive Musculoskeletal: no clubbing / cyanosis.  Skin: LE lymphedema, no new rashes Neurologic: No focal deficits   Data Reviewed: I have independently reviewed following labs and imaging studies  CBC: Recent Labs  Lab 07/30/21 0252 08/02/21 0301  WBC 6.4 6.4  HGB 11.8* 11.5*  HCT 38.3* 37.8*  MCV 88.9 88.5  PLT 187 208    Basic Metabolic Panel: Recent Labs  Lab 07/28/21 0243 07/29/21 0848 07/30/21 0252 07/31/21 0515 08/02/21 0301  NA 138 138 138 139 137  K 3.9 4.0 3.8 3.8 3.8  CL 98 96* 96* 96* 99  CO2 34* 28 33* 37* 30  GLUCOSE 113* 105* 113* 123* 116*  BUN 14 13 17 18  21*  CREATININE 1.25* 1.14 1.25* 1.25* 1.19  CALCIUM 8.0* 8.2* 8.4* 8.3* 8.4*    Liver Function Tests: No results for input(s): AST, ALT, ALKPHOS, BILITOT,  PROT, ALBUMIN in the last 168 hours.  Coagulation Profile: No results for input(s): INR, PROTIME in the last 168 hours.  HbA1C: No results for input(s): HGBA1C in the last 72 hours. CBG: No results for input(s): GLUCAP in the last 168 hours.  Recent Results (from the past 240 hour(s))  Resp Panel by RT-PCR (Flu A&B, Covid) Nasopharyngeal Swab     Status: None   Collection Time: 07/26/21  3:28 PM   Specimen: Nasopharyngeal Swab; Nasopharyngeal(NP) swabs in vial transport medium  Result Value Ref Range Status   SARS Coronavirus 2 by RT PCR NEGATIVE NEGATIVE Final    Comment: (NOTE) SARS-CoV-2 target nucleic acids are NOT DETECTED.  The SARS-CoV-2 RNA is generally detectable in upper respiratory specimens during the acute phase of infection. The lowest concentration of SARS-CoV-2 viral copies this assay can detect is 138 copies/mL. A negative  result does not preclude SARS-Cov-2 infection and should not be used as the sole basis for treatment or other patient management decisions. A negative result may occur with  improper specimen collection/handling, submission of specimen other than nasopharyngeal swab, presence of viral mutation(s) within the areas targeted by this assay, and inadequate number of viral copies(<138 copies/mL). A negative result must be combined with clinical observations, patient history, and epidemiological information. The expected result is Negative.  Fact Sheet for Patients:  BloggerCourse.com  Fact Sheet for Healthcare Providers:  SeriousBroker.it  This test is no t yet approved or cleared by the [place] FDA and  has been authorized for detection and/or diagnosis of SARS-CoV-2 by FDA under an Emergency Use Authorization (EUA). This EUA will remain  in effect (meaning this test can be used) for the duration of the COVID-19 declaration under Section 564(b)(1) of the Act, 21 U.S.C.section  360bbb-3(b)(1), unless the authorization is terminated  or revoked sooner.       Influenza A by PCR NEGATIVE NEGATIVE Final   Influenza B by PCR NEGATIVE NEGATIVE Final    Comment: (NOTE) The Xpert Xpress SARS-CoV-2/FLU/RSV plus assay is intended as an aid in the diagnosis of influenza from Nasopharyngeal swab specimens and should not be used as a sole basis for treatment. Nasal washings and aspirates are unacceptable for Xpert Xpress SARS-CoV-2/FLU/RSV testing.  Fact Sheet for Patients: BloggerCourse.com  Fact Sheet for Healthcare Providers: SeriousBroker.it  This test is not yet approved or cleared by the [place] FDA and has been authorized for detection and/or diagnosis of SARS-CoV-2 by FDA under an Emergency Use Authorization (EUA). This EUA will remain in effect (meaning this test can be used) for the duration of the COVID-19 declaration under Section 564(b)(1) of the Act, 21 U.S.C. section 360bbb-3(b)(1), unless the authorization is terminated or revoked.  Performed at [facility], [address], [place], [place] [address]       Radiology Studies: No results found.   Pamella Pert, MD, PhD [facility]  Between 7 am - 7 pm I am available, please contact me via Amion (for emergencies) or Securechat (non urgent messages)  Between 7 pm - 7 am I am not available, please contact night coverage MD/APP via Amion

## 2021-08-03 NOTE — Progress Notes (Signed)
Hunter King presents with acute on chronic hypoxic hypercapnic respiratory failure in the setting of COPD.   The use of the NIV will treat patients high PC02 levels (54.7 on 07/26/21 with elevated bicarbonate of 33.2) and 07/28/21 FEV1 31% and FVC 33% predicted.  NIV can reduce risk of exacerbations and future hospitalizations when used at night and during the day. All alternate devices [phone] and [id number]) have been proven ineffective to provide essential volume control necessary to maintain acceptable CO2 levels. An NIV with AVAPS AE is necessary to prevent patient harm.  Interruption or failure to provide NIV would quickly lead to exacerbation of the patients condition, hospital admission, and likely harm to the patient.  Continued use is preferred.  Patient is able to protect their airways and clear secretions on their own. Daleen Squibb, MSW, LCSW 8/18/20222:31 PM

## 2021-08-03 NOTE — Plan of Care (Signed)

## 2021-08-03 NOTE — TOC Progression Note (Signed)
Transition of Care [facility]) - Progression Note    Patient Details  Name: Hunter King MRN: [id number] Date of Birth: [birth year]  Transition of Care [facility]) CM/SW Contact  Lorri Frederick, LCSW Phone Number: 08/03/2021, 2:39 PM  Clinical Narrative:  CSW worked with Timothy Lasso from [employer] towards getting needed documentation to submit auth for trilogy.  Once obtained, Timothy Lasso will submit auth, hopefully today.      Expected Discharge Plan: IP Rehab Facility Barriers to Discharge: Other (must enter comment) (per CIR, not yet candidate based on how much assistance he is requiring)  Expected Discharge Plan and Services Expected Discharge Plan: IP Rehab Facility     Post Acute Care Choice: IP Rehab Living arrangements for the past 2 months: Single Family Home                                       Social Determinants of Health (SDOH) Interventions    Readmission Risk Interventions No flowsheet data found.

## 2021-08-03 NOTE — Progress Notes (Signed)
Inpatient Rehab Admissions Coordinator:   Awaiting determination from [facility] regarding prior auth request for CIR.  Will continue to follow.   Estill Dooms, PT, DPT Admissions Coordinator [phone] 08/03/21  11:01 AM

## 2021-08-04 DIAGNOSIS — J9602 Acute respiratory failure with hypercapnia: Secondary | ICD-10-CM | POA: Diagnosis not present

## 2021-08-04 DIAGNOSIS — J9601 Acute respiratory failure with hypoxia: Secondary | ICD-10-CM | POA: Diagnosis not present

## 2021-08-04 LAB — CBC
HCT: 38.8 % — ABNORMAL LOW (ref 39.0–52.0)
Hemoglobin: 11.7 g/dL — ABNORMAL LOW (ref 13.0–17.0)
MCH: 26.8 pg (ref 26.0–34.0)
MCHC: 30.2 g/dL (ref 30.0–36.0)
MCV: 88.8 fL (ref 80.0–100.0)
Platelets: 240 10*3/uL (ref 150–400)
RBC: 4.37 MIL/uL (ref 4.22–5.81)
RDW: 16.3 % — ABNORMAL HIGH (ref 11.5–15.5)
WBC: 6.9 10*3/uL (ref 4.0–10.5)
nRBC: 0 % (ref 0.0–0.2)

## 2021-08-04 LAB — BASIC METABOLIC PANEL
Anion gap: 9 (ref 5–15)
BUN: 15 mg/dL (ref 6–20)
CO2: 30 mmol/L (ref 22–32)
Calcium: 8.5 mg/dL — ABNORMAL LOW (ref 8.9–10.3)
Chloride: 97 mmol/L — ABNORMAL LOW (ref 98–111)
Creatinine, Ser: 1.11 mg/dL (ref 0.61–1.24)
GFR, Estimated: >60 mL/min (ref >60)
Glucose, Bld: 137 mg/dL — ABNORMAL HIGH (ref 70–99)
Potassium: 3.7 mmol/L (ref 3.5–5.1)
Sodium: 136 mmol/L (ref 135–145)

## 2021-08-04 NOTE — Progress Notes (Signed)
Occupational Therapy Treatment Patient Details Name: RUFINO STAUP MRN: [id number] DOB: [birth year] Today's Date: 08/04/2021    History of present illness Pt is a 55yo male who presents to [facility] ED on 8/10 secondary to recent syncope on 8/9. Of note, was seen at [facility] ED 8/9 and evaluated for left knee pain but pt and family did not feel that syncopal episode was appropriately evaluated. Noted to be hypoxic. CTA of Chest showed mild diffuse ground-glass opacities suggestive of atelectasis or mild  edema. PMH: chronic lymphodema, DM, Morbid obesity, R knee surgery, bradycardia, anxiety, depression.   OT comments  Excellent session. Able to complete bed mobility with S. Completed UB dressing with set up and min A for LB dressing. Ambulated to door while pushing recliner x 3 with rest breaks in between walks on 2L with VSS. 1/4 DOE, which is significantly improved. Would have liked to have sat up in chair however bari chair not available. Both bari chair and bari BSC have been ordered. Called portable again to request equipment.  Will continue to follow acutely. If CIR denied, will need HHOT.  Pt very appreciative.   Follow Up Recommendations  CIR (if denied will need HHOT)    Equipment Recommendations  3 in 1 bedside commode (bariatric)    Recommendations for Other Services Rehab consult    Precautions / Restrictions Precautions Precautions: Fall Precaution Comments: OCD; agoraphobic; skin breakdown       Mobility Bed Mobility Overal bed mobility: Needs Assistance       Supine to sit: Supervision          Transfers Overall transfer level: Needs assistance   Transfers: Sit to/from Stand Sit to Stand: Supervision;+2 safety/equipment              Balance     Sitting balance-Leahy Scale: Good       Standing balance-Leahy Scale: Fair                             ADL either performed or assessed with clinical judgement   ADL                    Upper Body Dressing : Set up   Lower Body Dressing: Minimal assistance;Sit to/from stand               Functional mobility during ADLs: Min guard;+2 for safety/equipment (pushing recliner) General ADL Comments: Discussed use of toilet tong for pericare - issued after session; pt's goal is to [facility] to [facility] and become more independent with self care     Vision       Perception     Praxis      Cognition Arousal/Alertness: Awake/alert Behavior During Therapy: Anxious;WFL for tasks assessed/performed (minimally anxious) Overall Cognitive Status: Within Functional Limits for tasks assessed                                          Exercises Other Exercises Other Exercises: completing BUE strengthening with theraband   Shoulder Instructions       General Comments VSS on 2L    Pertinent Vitals/ Pain       Pain Assessment: Faces Faces Pain Scale: Hurts a little bit Pain Location: LLE below knee Pain Descriptors / Indicators: Discomfort Pain Intervention(s): Limited activity within patient's tolerance  Home  Living                                          Prior Functioning/Environment              Frequency  Min 2X/week        Progress Toward Goals  OT Goals(current goals can now be found in the care plan section)  Progress towards OT goals: Progressing toward goals  Acute Rehab OT Goals Patient Stated Goal: to do some rehab before going home; to not fall OT Goal Formulation: With patient Time For Goal Achievement: 08/10/21 Potential to Achieve Goals: Good ADL Goals Pt Will Perform Lower Body Bathing: with adaptive equipment;with supervision;with set-up Pt Will Perform Lower Body Dressing: with supervision;with set-up;with adaptive equipment;sit to/from stand Pt Will Transfer to Toilet: with modified independence;ambulating Pt Will Perform Toileting - Clothing Manipulation and hygiene: with modified  independence;sit to/from stand;sitting/lateral leans;with adaptive equipment Pt/caregiver will Perform Home Exercise Program: Increased strength;Independently;With theraband;With written HEP provided Additional ADL Goal #1: Pt will verbalize 2 strateiges to manage MASD in skin folds and assist with care  Plan Discharge plan remains appropriate    Co-evaluation                 AM-PAC OT "6 Clicks" Daily Activity     Outcome Measure   Help from another person eating meals?: None Help from another person taking care of personal grooming?: A Little Help from another person toileting, which includes using toliet, bedpan, or urinal?: A Lot Help from another person bathing (including washing, rinsing, drying)?: A Lot Help from another person to put on and taking off regular upper body clothing?: A Little Help from another person to put on and taking off regular lower body clothing?: A Little 6 Click Score: 17    End of Session Equipment Utilized During Treatment: Oxygen (2L)  OT Visit Diagnosis: Other abnormalities of gait and mobility (R26.89);Muscle weakness (generalized) (M62.81);History of falling (Z91.81);Pain Pain - Right/Left: Left Pain - part of body: Shoulder;Knee   Activity Tolerance Patient tolerated treatment well   Patient Left in bed;with call bell/phone within reach (no chair available)   Nurse Communication Mobility status        Time: 1132-1208 OT Time Calculation (min): 36 min  Charges: OT General Charges $OT Visit: 1 Visit OT Treatments $Self Care/Home Management : 23-37 mins  [name], OT/L   Acute OT Clinical Specialist Acute Rehabilitation Services Pager [phone] Office [phone]    [facility] 08/04/2021, 4:21 PM

## 2021-08-04 NOTE — Consult Note (Signed)
WOC consulted regarding wound care for LEs patient with congenital lymphedema. His sister has been caring for his LEs for about 3 years per her report.  I talked with her extensively over the phone.  They prefer the gradient compression wraps that the patient uses at home. We discussed options for his care while inpatient.  I have explained the lack of certified lymphedema therapist and that they are more common in the outpatient clinic setting. She is very appreciative of feedback and is very aware of resources. She managed lymphedema in her own child for a number of years.   She changed the compression wraps on his legs yesterday 08/03/21 and states they change them at home about every 3 days. She is not familiar with the products that he uses but has been trained to use the products.  She request that I discuss products and the patient's desire to have his sister continue to provide his lymphedema therapy while inpatient.  We agree that would be a good plan bc of the lack of therapist trained to provide the services.   I then contacted the patient in his room and we discussed the treatment POC, he wishes for his sister to provide his bilateral LE wraps every 3-4 days. Despite trying to provide comparable supplies his supplies are used for gradient lymphedema therapy and he wishes to continue to use these as well.   I have updated the patient's orders to reflect our above conversation.   Discussed POC with patient and bedside nurse.  Re consult if needed, will not follow at this time. Thanks  Tamicka Shimon [employer], RN,CWOCN, CNS, CWON-AP [phone])

## 2021-08-04 NOTE — Progress Notes (Signed)
Inpatient Rehab Admissions Coordinator:   Received a denial from [facility] for CIR request citing no medical necessity.  I spoke to Dr. Riley Kill, rehab MD, who feels we would not be able to successfully argue this fact.  Pt will need to seek f/u in a lower level of care.  I left a message for Daleen Squibb to follow up.   Estill Dooms, PT, DPT Admissions Coordinator [phone] 08/04/21  10:00 AM

## 2021-08-04 NOTE — Progress Notes (Signed)
PROGRESS NOTE  Hunter King ZOX:[id number] DOB: 1966-12-02 DOA: 07/26/2021 PCP: Avis Epley, PA-C   LOS: 8 days   Brief Narrative / Interim history: 55 year old with past medical history significant for anxiety, depression, lymphedema, morbid obesity presents after having a fall at home.  Patient fell at home and passed out.  That time patient was evaluated at [facility] they check x-ray of his knee and he was discharged home. He presented to the ED [facility] again for further evaluation of syncopal episode. Patient was also noted to be hypoxic down to 87 on room air.  Chest x-ray showed cardiomegaly.  CT angio negative for PE, showed mild diffuse groundglass opacities suggest atelectasis or mild edema.  Subjective / 24h Interval events: No significant complaints today  Assessment & Plan: Principal Problem Acute on chronic hypoxic hypercapnic respiratory failure in the setting of obesity hypoventilation syndrome, acute on chronic diastolic heart failure exacerbation as well as COPD with severe obstructive disease -initially diuresed with IV Lasix, now converted to po.  Net -27 L -He will need BiPAP at discharge.  Arranging trilogy -BiPAP at bedtime while in the hospital. -Spirometry done during his hospital stay showed severe obstructive disease -Monitor renal function -Weight improved by about 50 pounds to around 550, stable -Insurance denied his admission to inpatient rehab, patient plans to appeal.  He does not think he will be able to manage things at home given limited space and his need for assistive devices  Active Problems Syncope: Likely Vasovagal.  CTA negative for PE. ECHO no aortic valve stenosis.    Lymphedema -chronic: Continue with wound care   Elevated D dimer -CT negative for PE -[place] negative for DVT.    Anxiety, Depression -Continue with Zoloft and Mirtazapine.    Obesity, class III  -Needs life style modification. BMI 70   Shoulder,  knee pain-Continue with tylenol schedule for few days, Voltaren gel ordered.    Axilla intertriginous Dermatitis; started  Nystatin. Improved.   Scheduled Meds:  diclofenac Sodium  2 g Topical QID   furosemide  40 mg Oral BID   guaiFENesin  600 mg Oral BID   heparin injection (subcutaneous)  5,000 Units Subcutaneous Q8H   ipratropium-albuterol  3 mL Nebulization BID   mirtazapine  15 mg Oral QHS   nystatin   Topical TID   sertraline  150 mg Oral Daily   sodium chloride flush  3 mL Intravenous Q12H   Continuous Infusions: PRN Meds:.acetaminophen **OR** acetaminophen, albuterol, loratadine, ondansetron **OR** ondansetron (ZOFRAN) IV, traMADol  Diet Orders (From admission, onward)     Start     Ordered   07/26/21 1725  Diet Heart Room service appropriate? Yes; Fluid consistency: Thin  Diet effective now       Question Answer Comment  Room service appropriate? Yes   Fluid consistency: Thin      07/26/21 1726            DVT prophylaxis: heparin injection 5,000 Units Start: 07/27/21 1400     Code Status: Full Code  Family Communication: no family at bedside  Status is: Inpatient  Remains inpatient appropriate because:Unsafe d/c plan and Inpatient level of care appropriate due to severity of illness  Dispo: The patient is from: Home              Anticipated d/c is to: SNF              Patient currently is medically stable to d/c.  Difficult to place patient No  Level of care: Telemetry Medical  Consultants:  none  Procedures:  2D echo:   1. Left ventricular ejection fraction, by estimation, is 65 to 70%. The left ventricle has normal function. The left ventricle has no regional wall motion abnormalities. There is moderate left ventricular hypertrophy. Left ventricular diastolic parameters are consistent with Grade II diastolic dysfunction (pseudonormalization). Elevated left ventricular end-diastolic pressure. The E/e' is 15.   2. Right ventricular systolic  function is normal. The right ventricular size is mildly enlarged. There is mildly elevated pulmonary artery systolic pressure. The estimated right ventricular systolic pressure is 44.7 mmHg.   3. The mitral valve is grossly normal. No evidence of mitral valve regurgitation.   4. The aortic valve is tricuspid. Aortic valve regurgitation is not visualized.   5. The inferior vena cava is dilated in size with >50% respiratory variability, suggesting right atrial pressure of 8 mmHg.   Microbiology  none  Antimicrobials: none    Objective: Vitals:   08/03/21 2332 08/04/21 0357 08/04/21 0604 08/04/21 0730  BP:   138/89   Pulse: 88 69 60 67  Resp: 16 19 20 19   Temp:   98.6 F (37 C)   TempSrc:   Axillary   SpO2: 95% 97% 97% 95%  Weight:        Intake/Output Summary (Last 24 hours) at 08/04/2021 1038 Last data filed at 08/04/2021 1034 Gross per 24 hour  Intake 477 ml  Output 3025 ml  Net -2548 ml    Filed Weights   07/30/21 0438 08/01/21 0900 08/02/21 0532  Weight: (!) 274.4 kg (!) 251.6 kg (!) 250.5 kg    Examination:  Constitutional: NAD, in bed Neck: normal, supple Respiratory: Clear bilaterally, no wheezing Cardiovascular: Regular rate and rhythm, no murmurs  Data Reviewed: I have independently reviewed following labs and imaging studies  CBC: Recent Labs  Lab 07/30/21 0252 08/02/21 0301 08/04/21 0247  WBC 6.4 6.4 6.9  HGB 11.8* 11.5* 11.7*  HCT 38.3* 37.8* 38.8*  MCV 88.9 88.5 88.8  PLT 187 208 240    Basic Metabolic Panel: Recent Labs  Lab 07/29/21 0848 07/30/21 0252 07/31/21 0515 08/02/21 0301 08/04/21 0247  NA 138 138 139 137 136  K 4.0 3.8 3.8 3.8 3.7  CL 96* 96* 96* 99 97*  CO2 28 33* 37* 30 30  GLUCOSE 105* 113* 123* 116* 137*  BUN 13 17 18  21* 15  CREATININE 1.14 1.25* 1.25* 1.19 1.11  CALCIUM 8.2* 8.4* 8.3* 8.4* 8.5*    Liver Function Tests: No results for input(s): AST, ALT, ALKPHOS, BILITOT, PROT, ALBUMIN in the last 168  hours.  Coagulation Profile: No results for input(s): INR, PROTIME in the last 168 hours.  HbA1C: No results for input(s): HGBA1C in the last 72 hours. CBG: No results for input(s): GLUCAP in the last 168 hours.  Recent Results (from the past 240 hour(s))  Resp Panel by RT-PCR (Flu A&B, Covid) Nasopharyngeal Swab     Status: None   Collection Time: 07/26/21  3:28 PM   Specimen: Nasopharyngeal Swab; Nasopharyngeal(NP) swabs in vial transport medium  Result Value Ref Range Status   SARS Coronavirus 2 by RT PCR NEGATIVE NEGATIVE Final    Comment: (NOTE) SARS-CoV-2 target nucleic acids are NOT DETECTED.  The SARS-CoV-2 RNA is generally detectable in upper respiratory specimens during the acute phase of infection. The lowest concentration of SARS-CoV-2 viral copies this assay can detect is 138 copies/mL. A negative result does  not preclude SARS-Cov-2 infection and should not be used as the sole basis for treatment or other patient management decisions. A negative result may occur with  improper specimen collection/handling, submission of specimen other than nasopharyngeal swab, presence of viral mutation(s) within the areas targeted by this assay, and inadequate number of viral copies(<138 copies/mL). A negative result must be combined with clinical observations, patient history, and epidemiological information. The expected result is Negative.  Fact Sheet for Patients:  BloggerCourse.com  Fact Sheet for Healthcare Providers:  SeriousBroker.it  This test is no t yet approved or cleared by the [place] FDA and  has been authorized for detection and/or diagnosis of SARS-CoV-2 by FDA under an Emergency Use Authorization (EUA). This EUA will remain  in effect (meaning this test can be used) for the duration of the COVID-19 declaration under Section 564(b)(1) of the Act, 21 U.S.C.section 360bbb-3(b)(1), unless the authorization is  terminated  or revoked sooner.       Influenza A by PCR NEGATIVE NEGATIVE Final   Influenza B by PCR NEGATIVE NEGATIVE Final    Comment: (NOTE) The Xpert Xpress SARS-CoV-2/FLU/RSV plus assay is intended as an aid in the diagnosis of influenza from Nasopharyngeal swab specimens and should not be used as a sole basis for treatment. Nasal washings and aspirates are unacceptable for Xpert Xpress SARS-CoV-2/FLU/RSV testing.  Fact Sheet for Patients: BloggerCourse.com  Fact Sheet for Healthcare Providers: SeriousBroker.it  This test is not yet approved or cleared by the [place] FDA and has been authorized for detection and/or diagnosis of SARS-CoV-2 by FDA under an Emergency Use Authorization (EUA). This EUA will remain in effect (meaning this test can be used) for the duration of the COVID-19 declaration under Section 564(b)(1) of the Act, 21 U.S.C. section 360bbb-3(b)(1), unless the authorization is terminated or revoked.  Performed at [facility], [address] [place], [place] [address]       Radiology Studies: No results found.   Pamella Pert, MD, PhD [facility]  Between 7 am - 7 pm I am available, please contact me via Amion (for emergencies) or Securechat (non urgent messages)  Between 7 pm - 7 am I am not available, please contact night coverage MD/APP via Amion

## 2021-08-04 NOTE — TOC Progression Note (Signed)
Transition of Care [facility]) - Progression Note    Patient Details  Name: Hunter King MRN: [id number] Date of Birth: [birth year]  [facility]) CM/SW Contact  Erin Sons, [place] Phone Number: 08/04/2021, 4:19 PM  Clinical Narrative:     CSW is informed by [employer] that pt is approved for NIV (trilogy). [employer] informed CSW that pt will have a copay. They have been unable to reach pt yet regarding copay for Trilogy.   Expected Discharge Plan: IP Rehab Facility Barriers to Discharge: Other (must enter comment) (per CIR, not yet candidate based on how much assistance he is requiring)  Expected Discharge Plan and Services Expected Discharge Plan: IP Rehab Facility     Post Acute Care Choice: IP Rehab Living arrangements for the past 2 months: Single Family Home                                       Social Determinants of Health (SDOH) Interventions    Readmission Risk Interventions No flowsheet data found.

## 2021-08-04 NOTE — Progress Notes (Signed)
Inpatient Rehab Admissions Coordinator:   Met with patient at bedside and he would like to appeal [employer] decision.  I will help him do this.   Caitlin Warren, PT, DPT Admissions Coordinator [phone] 08/04/21  3:54 PM  

## 2021-08-05 DIAGNOSIS — J9602 Acute respiratory failure with hypercapnia: Secondary | ICD-10-CM | POA: Diagnosis not present

## 2021-08-05 DIAGNOSIS — J9601 Acute respiratory failure with hypoxia: Secondary | ICD-10-CM | POA: Diagnosis not present

## 2021-08-05 NOTE — Plan of Care (Signed)

## 2021-08-05 NOTE — Progress Notes (Signed)
Physical Therapy Treatment Patient Details Name: Hunter King MRN: [id number] DOB: [birth year] Today's Date: 08/05/2021    History of Present Illness Pt is a 55yo male who presents to [facility] ED on 8/10 secondary to recent syncope on 8/9. Of note, was seen at [facility] ED 8/9 and evaluated for left knee pain but pt and family did not feel that syncopal episode was appropriately evaluated. Noted to be hypoxic. CTA of Chest showed mild diffuse ground-glass opacities suggestive of atelectasis or mild  edema. PMH: chronic lymphodema, DM, Morbid obesity, R knee surgery, bradycardia, anxiety, depression.    PT Comments    Pt supine in bed on arrival.  He is mildly anxious but able to relax and follow commands to progress mobility.  Pt trialed bariatric rollator this session and this worked quite well form him.  Pt continues to benefit from skilled rehab in a post acute setting to improve strength, function and endurance before returning home.      Follow Up Recommendations  CIR (pending progression.  Pt may be able to d/c home directly.)     Equipment Recommendations  3in1 (PT);Other (comment);Hospital bed (bariatric rollator and bariatric bed side commode. bariatric hospital bed.)    Recommendations for Other Services       Precautions / Restrictions Precautions Precautions: Fall Precaution Comments: OCD; agoraphobic; skin breakdown Restrictions Weight Bearing Restrictions: No    Mobility  Bed Mobility Overal bed mobility: Needs Assistance Bed Mobility: Supine to Sit Rolling: Supervision         General bed mobility comments: heavy use of momentum, bed rails adn increased HOB.    Transfers Overall transfer level: Needs assistance Equipment used: 4-wheeled walker (bariatric) Transfers: Sit to/from Stand Sit to Stand: Supervision;+2 safety/equipment         General transfer comment: Pt performed x4 reps, 3 reps from elevated bed and x 1 from recliner chair.  Pt used rocking  momentum to achieve standing from recliner chair and pushed from B arm rests.  He did not use UEs to rise from bed.  Ambulation/Gait Ambulation/Gait assistance: Min guard;+2 safety/equipment Gait Distance (Feet): 20 Feet (+ 10 ft)   Gait Pattern/deviations: Step-through pattern;Decreased stride length;Trunk flexed     General Gait Details: Pt required cues to stand closer to device.  Pt fatigues quickly with short bouts of gt training.   Stairs Stairs: Yes Stairs assistance: Min assist Stair Management: Two rails Number of Stairs: 1 (2 inch height to step on scale for weight.) General stair comments: Cues for sequencing and use of rails.   Wheelchair Mobility    Modified Rankin (Stroke Patients Only)       Balance Overall balance assessment: Needs assistance Sitting-balance support: Bilateral upper extremity supported;Feet supported Sitting balance-Leahy Scale: Good       Standing balance-Leahy Scale: Fair                              Cognition Arousal/Alertness: Awake/alert Behavior During Therapy: Anxious;WFL for tasks assessed/performed Overall Cognitive Status: Within Functional Limits for tasks assessed                                 General Comments: More motivated this session.      Exercises      General Comments        Pertinent Vitals/Pain Pain Assessment: No/denies pain    Home  Living                      Prior Function            PT Goals (current goals can now be found in the care plan section) Acute Rehab PT Goals Patient Stated Goal: to do some rehab before going home; to not fall Potential to Achieve Goals: Good Progress towards PT goals: Progressing toward goals    Frequency    Min 3X/week      PT Plan Current plan remains appropriate    Co-evaluation              AM-PAC PT "6 Clicks" Mobility   Outcome Measure  Help needed turning from your back to your side while in a flat  bed without using bedrails?: A Little Help needed moving from lying on your back to sitting on the side of a flat bed without using bedrails?: A Little Help needed moving to and from a bed to a chair (including a wheelchair)?: A Little Help needed standing up from a chair using your arms (e.g., wheelchair or bedside chair)?: A Little Help needed to walk in hospital room?: A Little Help needed climbing 3-5 steps with a railing? : A Little 6 Click Score: 18    End of Session Equipment Utilized During Treatment: Oxygen (2L via nasal cannula.) Activity Tolerance: Patient tolerated treatment well Patient left: in bed;with call bell/phone within reach Nurse Communication: Mobility status PT Visit Diagnosis: History of falling (Z91.81);Muscle weakness (generalized) (M62.81);Difficulty in walking, not elsewhere classified (R26.2)     Time: 8295-6213 PT Time Calculation (min) (ACUTE ONLY): 30 min  Charges:  $Gait Training: 8-22 mins $Therapeutic Activity: 8-22 mins                     Bonney Leitz , PTA Acute Rehabilitation Services Pager [phone] Office [phone]    Lumir Demetriou Artis Delay 08/05/2021, 12:54 PM

## 2021-08-05 NOTE — Progress Notes (Signed)
PROGRESS NOTE  Hunter MABEE LZJ:[id number] DOB: 16-Aug-1966 DOA: 07/26/2021 PCP: Avis Epley, PA-C   LOS: 9 days   Brief Narrative / Interim history: 55 year old with past medical history significant for anxiety, depression, lymphedema, morbid obesity presents after having a fall at home.  Patient fell at home and passed out.  That time patient was evaluated at [facility] they check x-ray of his knee and he was discharged home. He presented to the ED [facility] again for further evaluation of syncopal episode. Patient was also noted to be hypoxic down to 87 on room air.  Chest x-ray showed cardiomegaly.  CT angio negative for PE, showed mild diffuse groundglass opacities suggest atelectasis or mild edema.  Subjective / 24h Interval events: Feels a little bit weaker today but otherwise no shortness of breath, no chest pain, no other problems  Assessment & Plan: Principal Problem Acute on chronic hypoxic hypercapnic respiratory failure in the setting of obesity hypoventilation syndrome, acute on chronic diastolic heart failure exacerbation as well as COPD with severe obstructive disease -initially diuresed with IV Lasix, now converted to po.  Net -27 L -He will need BiPAP at discharge.  Arranging trilogy -BiPAP at bedtime while in the hospital. -Spirometry done during his hospital stay showed severe obstructive disease -Monitor renal function -Weight improved from around 600 pounds on admission to 517 this morning -Insurance denied his admission to inpatient rehab, appeal is underway  Active Problems Syncope: Likely Vasovagal.  CTA negative for PE. ECHO no aortic valve stenosis.    Lymphedema -chronic: Continue with wound care   Elevated D dimer -CT negative for PE -[place] negative for DVT.    Anxiety, Depression -Continue with Zoloft and Mirtazapine.    Obesity, class III  -Needs life style modification.    Shoulder, knee pain-Continue with tylenol schedule  for few days, Voltaren gel ordered.    Axilla intertriginous Dermatitis; started  Nystatin. Improved.   Scheduled Meds:  diclofenac Sodium  2 g Topical QID   furosemide  40 mg Oral BID   guaiFENesin  600 mg Oral BID   heparin injection (subcutaneous)  5,000 Units Subcutaneous Q8H   ipratropium-albuterol  3 mL Nebulization BID   mirtazapine  15 mg Oral QHS   nystatin   Topical TID   sertraline  150 mg Oral Daily   sodium chloride flush  3 mL Intravenous Q12H   Continuous Infusions: PRN Meds:.acetaminophen **OR** acetaminophen, albuterol, loratadine, ondansetron **OR** ondansetron (ZOFRAN) IV, traMADol  Diet Orders (From admission, onward)     Start     Ordered   07/26/21 1725  Diet Heart Room service appropriate? Yes; Fluid consistency: Thin  Diet effective now       Question Answer Comment  Room service appropriate? Yes   Fluid consistency: Thin      07/26/21 1726            DVT prophylaxis: heparin injection 5,000 Units Start: 07/27/21 1400     Code Status: Full Code  Family Communication: no family at bedside  Status is: Inpatient  Remains inpatient appropriate because:Unsafe d/c plan and Inpatient level of care appropriate due to severity of illness  Dispo: The patient is from: Home              Anticipated d/c is to: SNF              Patient currently is medically stable to d/c.   Difficult to place patient No  Level of care:  Telemetry Medical  Consultants:  none  Procedures:  2D echo:   1. Left ventricular ejection fraction, by estimation, is 65 to 70%. The left ventricle has normal function. The left ventricle has no regional wall motion abnormalities. There is moderate left ventricular hypertrophy. Left ventricular diastolic parameters are consistent with Grade II diastolic dysfunction (pseudonormalization). Elevated left ventricular end-diastolic pressure. The E/e' is 15.   2. Right ventricular systolic function is normal. The right ventricular size  is mildly enlarged. There is mildly elevated pulmonary artery systolic pressure. The estimated right ventricular systolic pressure is 44.7 mmHg.   3. The mitral valve is grossly normal. No evidence of mitral valve regurgitation.   4. The aortic valve is tricuspid. Aortic valve regurgitation is not visualized.   5. The inferior vena cava is dilated in size with >50% respiratory variability, suggesting right atrial pressure of 8 mmHg.   Microbiology  none  Antimicrobials: none    Objective: Vitals:   08/04/21 2328 08/05/21 0334 08/05/21 0521 08/05/21 0744  BP:   130/69   Pulse: 66  62   Resp: 20 16 17    Temp:   98.6 F (37 C)   TempSrc:   Axillary   SpO2: 100%  94% 96%  Weight:        Intake/Output Summary (Last 24 hours) at 08/05/2021 1203 Last data filed at 08/04/2021 2253 Gross per 24 hour  Intake 720 ml  Output 2150 ml  Net -1430 ml    Filed Weights   07/30/21 0438 08/01/21 0900 08/02/21 0532  Weight: (!) 274.4 kg (!) 251.6 kg (!) 250.5 kg    Examination:  Constitutional: nad Respiratory: CTA Cardiovascular: RRR  Data Reviewed: I have independently reviewed following labs and imaging studies  CBC: Recent Labs  Lab 07/30/21 0252 08/02/21 0301 08/04/21 0247  WBC 6.4 6.4 6.9  HGB 11.8* 11.5* 11.7*  HCT 38.3* 37.8* 38.8*  MCV 88.9 88.5 88.8  PLT 187 208 240    Basic Metabolic Panel: Recent Labs  Lab 07/30/21 0252 07/31/21 0515 08/02/21 0301 08/04/21 0247  NA 138 139 137 136  K 3.8 3.8 3.8 3.7  CL 96* 96* 99 97*  CO2 33* 37* 30 30  GLUCOSE 113* 123* 116* 137*  BUN 17 18 21* 15  CREATININE 1.25* 1.25* 1.19 1.11  CALCIUM 8.4* 8.3* 8.4* 8.5*    Liver Function Tests: No results for input(s): AST, ALT, ALKPHOS, BILITOT, PROT, ALBUMIN in the last 168 hours.  Coagulation Profile: No results for input(s): INR, PROTIME in the last 168 hours.  HbA1C: No results for input(s): HGBA1C in the last 72 hours. CBG: No results for input(s): GLUCAP in the  last 168 hours.  Recent Results (from the past 240 hour(s))  Resp Panel by RT-PCR (Flu A&B, Covid) Nasopharyngeal Swab     Status: None   Collection Time: 07/26/21  3:28 PM   Specimen: Nasopharyngeal Swab; Nasopharyngeal(NP) swabs in vial transport medium  Result Value Ref Range Status   SARS Coronavirus 2 by RT PCR NEGATIVE NEGATIVE Final    Comment: (NOTE) SARS-CoV-2 target nucleic acids are NOT DETECTED.  The SARS-CoV-2 RNA is generally detectable in upper respiratory specimens during the acute phase of infection. The lowest concentration of SARS-CoV-2 viral copies this assay can detect is 138 copies/mL. A negative result does not preclude SARS-Cov-2 infection and should not be used as the sole basis for treatment or other patient management decisions. A negative result may occur with  improper specimen collection/handling, submission of  specimen other than nasopharyngeal swab, presence of viral mutation(s) within the areas targeted by this assay, and inadequate number of viral copies(<138 copies/mL). A negative result must be combined with clinical observations, patient history, and epidemiological information. The expected result is Negative.  Fact Sheet for Patients:  BloggerCourse.com  Fact Sheet for Healthcare Providers:  SeriousBroker.it  This test is no t yet approved or cleared by the [place] FDA and  has been authorized for detection and/or diagnosis of SARS-CoV-2 by FDA under an Emergency Use Authorization (EUA). This EUA will remain  in effect (meaning this test can be used) for the duration of the COVID-19 declaration under Section 564(b)(1) of the Act, 21 U.S.C.section 360bbb-3(b)(1), unless the authorization is terminated  or revoked sooner.       Influenza A by PCR NEGATIVE NEGATIVE Final   Influenza B by PCR NEGATIVE NEGATIVE Final    Comment: (NOTE) The Xpert Xpress SARS-CoV-2/FLU/RSV plus assay is  intended as an aid in the diagnosis of influenza from Nasopharyngeal swab specimens and should not be used as a sole basis for treatment. Nasal washings and aspirates are unacceptable for Xpert Xpress SARS-CoV-2/FLU/RSV testing.  Fact Sheet for Patients: BloggerCourse.com  Fact Sheet for Healthcare Providers: SeriousBroker.it  This test is not yet approved or cleared by the [place] FDA and has been authorized for detection and/or diagnosis of SARS-CoV-2 by FDA under an Emergency Use Authorization (EUA). This EUA will remain in effect (meaning this test can be used) for the duration of the COVID-19 declaration under Section 564(b)(1) of the Act, 21 U.S.C. section 360bbb-3(b)(1), unless the authorization is terminated or revoked.  Performed at [facility], [address], [place], [place] [address]       Radiology Studies: No results found.   Pamella Pert, MD, PhD [facility]  Between 7 am - 7 pm I am available, please contact me via Amion (for emergencies) or Securechat (non urgent messages)  Between 7 pm - 7 am I am not available, please contact night coverage MD/APP via Amion

## 2021-08-06 DIAGNOSIS — J9602 Acute respiratory failure with hypercapnia: Secondary | ICD-10-CM | POA: Diagnosis not present

## 2021-08-06 DIAGNOSIS — J9601 Acute respiratory failure with hypoxia: Secondary | ICD-10-CM | POA: Diagnosis not present

## 2021-08-06 NOTE — Progress Notes (Signed)
PROGRESS NOTE  Hunter King ATF:[id number] DOB: Jan 20, 1966 DOA: 07/26/2021 PCP: Avis Epley, PA-C   LOS: 10 days   Brief Narrative / Interim history: 55 year old with past medical history significant for anxiety, depression, lymphedema, morbid obesity presents after having a fall at home.  Patient fell at home and passed out.  That time patient was evaluated at [facility] they check x-ray of his knee and he was discharged home. He presented to the ED [facility] again for further evaluation of syncopal episode. Patient was also noted to be hypoxic down to 87 on room air.  Chest x-ray showed cardiomegaly.  CT angio negative for PE, showed mild diffuse groundglass opacities suggest atelectasis or mild edema.  Subjective / 24h Interval events: No complaints this morning, no shortness of breath, no chest pain  Assessment & Plan: Principal Problem Acute on chronic hypoxic hypercapnic respiratory failure in the setting of obesity hypoventilation syndrome, acute on chronic diastolic heart failure exacerbation as well as COPD with severe obstructive disease -initially diuresed with IV Lasix, now converted to po.  Net -33 L -He will need BiPAP at discharge.  Arranging trilogy -BiPAP at bedtime while in the hospital. -Spirometry done during his hospital stay showed severe obstructive disease -Monitor renal function -Weight improved from around 600 pounds on admission to currently at 517.  He feels lighter and much better overall  Active Problems Syncope Likely Vasovagal.  CTA negative for PE. ECHO no aortic valve stenosis.    Lymphedema -chronic: Continue with wound care   Elevated D dimer -CT negative for PE -[place] negative for DVT.    Anxiety, Depression -Continue with Zoloft and Mirtazapine.    Obesity, class III  -Needs life style modification.    Shoulder, knee pain-Continue with tylenol schedule for few days, Voltaren gel ordered.    Axilla intertriginous  Dermatitis; started  Nystatin. Improved.   Scheduled Meds:  diclofenac Sodium  2 g Topical QID   furosemide  40 mg Oral BID   guaiFENesin  600 mg Oral BID   heparin injection (subcutaneous)  5,000 Units Subcutaneous Q8H   mirtazapine  15 mg Oral QHS   nystatin   Topical TID   sertraline  150 mg Oral Daily   sodium chloride flush  3 mL Intravenous Q12H   Continuous Infusions: PRN Meds:.acetaminophen **OR** acetaminophen, albuterol, loratadine, ondansetron **OR** ondansetron (ZOFRAN) IV, traMADol  Diet Orders (From admission, onward)     Start     Ordered   07/26/21 1725  Diet Heart Room service appropriate? Yes; Fluid consistency: Thin  Diet effective now       Question Answer Comment  Room service appropriate? Yes   Fluid consistency: Thin      07/26/21 1726            DVT prophylaxis: heparin injection 5,000 Units Start: 07/27/21 1400     Code Status: Full Code  Family Communication: no family at bedside  Status is: Inpatient  Remains inpatient appropriate because:Unsafe d/c plan and Inpatient level of care appropriate due to severity of illness  Dispo: The patient is from: Home              Anticipated d/c is to: SNF              Patient currently is medically stable to d/c.   Difficult to place patient No  Level of care: Telemetry Medical  Consultants:  none  Procedures:  2D echo:   1. Left ventricular ejection  fraction, by estimation, is 65 to 70%. The left ventricle has normal function. The left ventricle has no regional wall motion abnormalities. There is moderate left ventricular hypertrophy. Left ventricular diastolic parameters are consistent with Grade II diastolic dysfunction (pseudonormalization). Elevated left ventricular end-diastolic pressure. The E/e' is 15.   2. Right ventricular systolic function is normal. The right ventricular size is mildly enlarged. There is mildly elevated pulmonary artery systolic pressure. The estimated right ventricular  systolic pressure is 44.7 mmHg.   3. The mitral valve is grossly normal. No evidence of mitral valve regurgitation.   4. The aortic valve is tricuspid. Aortic valve regurgitation is not visualized.   5. The inferior vena cava is dilated in size with >50% respiratory variability, suggesting right atrial pressure of 8 mmHg.   Microbiology  none  Antimicrobials: none    Objective: Vitals:   08/05/21 2135 08/05/21 2259 08/06/21 0415 08/06/21 0432  BP: (!) 110/57   139/65  Pulse: 66 74  63  Resp: 20 19 16 19   Temp: 98 F (36.7 C)   97.9 F (36.6 C)  TempSrc: Oral   Axillary  SpO2: 94% 95%  94%  Weight:    (!) 237.1 kg    Intake/Output Summary (Last 24 hours) at 08/06/2021 1322 Last data filed at 08/06/2021 0300 Gross per 24 hour  Intake --  Output 1700 ml  Net -1700 ml    Filed Weights   08/02/21 0532 08/05/21 1100 08/06/21 0432  Weight: (!) 250.5 kg (!) 234.5 kg (!) 237.1 kg    Examination:  Constitutional: NAD Respiratory: CTA Cardiovascular: RRR  Data Reviewed: I have independently reviewed following labs and imaging studies  CBC: Recent Labs  Lab 08/02/21 0301 08/04/21 0247  WBC 6.4 6.9  HGB 11.5* 11.7*  HCT 37.8* 38.8*  MCV 88.5 88.8  PLT 208 240    Basic Metabolic Panel: Recent Labs  Lab 07/31/21 0515 08/02/21 0301 08/04/21 0247  NA 139 137 136  K 3.8 3.8 3.7  CL 96* 99 97*  CO2 37* 30 30  GLUCOSE 123* 116* 137*  BUN 18 21* 15  CREATININE 1.25* 1.19 1.11  CALCIUM 8.3* 8.4* 8.5*    Liver Function Tests: No results for input(s): AST, ALT, ALKPHOS, BILITOT, PROT, ALBUMIN in the last 168 hours.  Coagulation Profile: No results for input(s): INR, PROTIME in the last 168 hours.  HbA1C: No results for input(s): HGBA1C in the last 72 hours. CBG: No results for input(s): GLUCAP in the last 168 hours.  No results found for this or any previous visit (from the past 240 hour(s)).     Radiology Studies: No results found.   08/06/21, MD, PhD [facility]  Between 7 am - 7 pm I am available, please contact me via Amion (for emergencies) or Securechat (non urgent messages)  Between 7 pm - 7 am I am not available, please contact night coverage MD/APP via Amion

## 2021-08-06 NOTE — Progress Notes (Signed)
RT NOTES: Pt removed from bipap and placed on 2lpm nasal cannula.

## 2021-08-06 NOTE — Progress Notes (Signed)
Occupational Therapy Treatment Patient Details Name: Hunter King MRN: [id number] DOB: 1966-07-24 Today's Date: 08/06/2021    History of present illness Pt is a 55yo male who presents to [facility] ED on 8/10 secondary to recent syncope on 8/9. Of note, was seen at [facility] ED 8/9 and evaluated for left knee pain but pt and family did not feel that syncopal episode was appropriately evaluated. Noted to be hypoxic. CTA of Chest showed mild diffuse ground-glass opacities suggestive of atelectasis or mild  edema. PMH: chronic lymphodema, DM, Morbid obesity, R knee surgery, bradycardia, anxiety, depression.   OT comments  Pt. Seen for skilled OT treatment session.  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  Continued education and return demo for use of reacher for LB adls.  Able to complete seated HEP with theraband all planes, good technique. Remains motivated and eager for CIR level therapies prior to home.    Follow Up Recommendations  CIR    Equipment Recommendations  3 in 1 bedside commode    Recommendations for Other Services Rehab consult    Precautions / Restrictions Precautions Precautions: Fall Precaution Comments: OCD; agoraphobic; skin breakdown       Mobility Bed Mobility Overal bed mobility: Needs Assistance Bed Mobility: Supine to Sit;Rolling Rolling: Supervision   Supine to sit: Supervision Sit to supine: Supervision   General bed mobility comments: utilized bed rails and bed functions but no physical assistance required    Transfers                 General transfer comment: sat un supported for entires session, able to scoot towards hob prior to laying down    Balance                                           ADL either performed or assessed with clinical judgement   ADL Overall ADL's : Needs assistance/impaired                     Lower Body Dressing: Sitting/lateral leans;Minimal assistance;With adaptive equipment;Cueing  for sequencing Lower Body Dressing Details (indicate cue type and reason): educated on use for the screw portion of reacher for pushing/removing socks off of heel, remains min a secondary to B leg wraps and difficulty getting socks over the wraps and toe nails                     Vision       Perception     Praxis      Cognition Arousal/Alertness: Awake/alert Behavior During Therapy: Anxious;WFL for tasks assessed/performed Overall Cognitive Status: Within Functional Limits for tasks assessed                                 General Comments: motivated and pleasant, talking throughout session about his life and his goals moving forward        Exercises General Exercises - Upper Extremity Shoulder Flexion: AROM;10 reps;Seated;Theraband Shoulder Extension: AROM;10 reps;Seated;Theraband Elbow Flexion: AROM;Both;10 reps;Seated;Theraband Elbow Extension: AROM;Both;10 reps;Seated;Theraband Other Exercises Other Exercises: completing BUE strengthening with theraband   Shoulder Instructions       General Comments  Has 2 sons, one still lives at home.  Has goals and hopes to continue to work on his agoraphobia. States he wants to go outside  and to one place each day.  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      Pertinent Vitals/ Pain       Pain Assessment: No/denies pain  Home Living                                          Prior Functioning/Environment              Frequency  Min 2X/week        Progress Toward Goals  OT Goals(current goals can now be found in the care plan section)  Progress towards OT goals: Progressing toward goals     Plan Discharge plan remains appropriate    Co-evaluation                 AM-PAC OT "6 Clicks" Daily Activity     Outcome Measure   Help from another person eating meals?: None Help from another person taking care of personal  grooming?: A Little Help from another person toileting, which includes using toliet, bedpan, or urinal?: A Lot Help from another person bathing (including washing, rinsing, drying)?: A Lot Help from another person to put on and taking off regular upper body clothing?: A Little Help from another person to put on and taking off regular lower body clothing?: A Little 6 Click Score: 17    End of Session Equipment Utilized During Treatment: Oxygen  OT Visit Diagnosis: Other abnormalities of gait and mobility (R26.89);Muscle weakness (generalized) (M62.81);History of falling (Z91.81);Pain Pain - Right/Left: Left Pain - part of body: Shoulder;Knee   Activity Tolerance Patient tolerated treatment well   Patient Left in bed;with call bell/phone within reach   Nurse Communication          Time: 0932-3557 OT Time Calculation (min): 28 min  Charges: OT General Charges $OT Visit: 1 Visit OT Treatments $Self Care/Home Management : 8-22 mins $Therapeutic Exercise: 8-22 mins  Boneta Lucks, COTA/L Acute Rehabilitation [phone]    Hunter King 08/06/2021, 10:55 AM

## 2021-08-06 NOTE — Progress Notes (Signed)
RT placed patient on V60 BIPAP at this time. No respiratory distress noted. RT will monitor as needed.

## 2021-08-07 DIAGNOSIS — J9602 Acute respiratory failure with hypercapnia: Secondary | ICD-10-CM | POA: Diagnosis not present

## 2021-08-07 DIAGNOSIS — J9601 Acute respiratory failure with hypoxia: Secondary | ICD-10-CM | POA: Diagnosis not present

## 2021-08-07 NOTE — Progress Notes (Signed)
PROGRESS NOTE  Hunter King [id number]:[id number] DOB: 1966-09-06 DOA: 07/26/2021 PCP: Avis Epley, PA-C   LOS: 11 days   Brief Narrative / Interim history: 55 year old with past medical history significant for anxiety, depression, lymphedema, morbid obesity presents after having a fall at home.  Patient fell at home and passed out.  That time patient was evaluated at [facility] they check x-ray of his knee and he was discharged home. He presented to the ED [facility] again for further evaluation of syncopal episode. Patient was also noted to be hypoxic down to 87 on room air.  Chest x-ray showed cardiomegaly.  CT angio negative for PE, showed mild diffuse groundglass opacities suggest atelectasis or mild edema.  Subjective / 24h Interval events: No complaints  Assessment & Plan: Principal Problem Acute on chronic hypoxic hypercapnic respiratory failure in the setting of obesity hypoventilation syndrome, acute on chronic diastolic heart failure exacerbation as well as COPD with severe obstructive disease -initially diuresed with IV Lasix, now converted to po.  Net -33 L -He will need BiPAP at discharge.  Arranging trilogy -BiPAP at bedtime while in the hospital. -Spirometry done during his hospital stay showed severe obstructive disease -Monitor renal function -Weight improved from around 600 pounds on admission to currently at 517.  He feels lighter and much better overall -Appeal to the insurance company regarding CIR admission pending  Active Problems Syncope Likely Vasovagal.  CTA negative for PE. ECHO no aortic valve stenosis.    Lymphedema -chronic: Continue with wound care   Elevated D dimer -CT negative for PE -[place] negative for DVT.    Anxiety, Depression -Continue with Zoloft and Mirtazapine.    Obesity, class III  -Needs life style modification.    Shoulder, knee pain-Continue with tylenol schedule for few days, Voltaren gel ordered.    Axilla  intertriginous Dermatitis; started  Nystatin. Improved.   Scheduled Meds:  diclofenac Sodium  2 g Topical QID   furosemide  40 mg Oral BID   guaiFENesin  600 mg Oral BID   heparin injection (subcutaneous)  5,000 Units Subcutaneous Q8H   mirtazapine  15 mg Oral QHS   nystatin   Topical TID   sertraline  150 mg Oral Daily   sodium chloride flush  3 mL Intravenous Q12H   Continuous Infusions: PRN Meds:.acetaminophen **OR** acetaminophen, albuterol, loratadine, ondansetron **OR** ondansetron (ZOFRAN) IV, traMADol  Diet Orders (From admission, onward)     Start     Ordered   07/26/21 1725  Diet Heart Room service appropriate? Yes; Fluid consistency: Thin  Diet effective now       Question Answer Comment  Room service appropriate? Yes   Fluid consistency: Thin      07/26/21 1726            DVT prophylaxis: heparin injection 5,000 Units Start: 07/27/21 1400     Code Status: Full Code  Family Communication: no family at bedside  Status is: Inpatient  Remains inpatient appropriate because:Unsafe d/c plan and Inpatient level of care appropriate due to severity of illness  Dispo: The patient is from: Home              Anticipated d/c is to: SNF              Patient currently is medically stable to d/c.   Difficult to place patient No  Level of care: Telemetry Medical  Consultants:  none  Procedures:  2D echo:   1. Left ventricular ejection  fraction, by estimation, is 65 to 70%. The left ventricle has normal function. The left ventricle has no regional wall motion abnormalities. There is moderate left ventricular hypertrophy. Left ventricular diastolic parameters are consistent with Grade II diastolic dysfunction (pseudonormalization). Elevated left ventricular end-diastolic pressure. The E/e' is 15.   2. Right ventricular systolic function is normal. The right ventricular size is mildly enlarged. There is mildly elevated pulmonary artery systolic pressure. The estimated  right ventricular systolic pressure is 44.7 mmHg.   3. The mitral valve is grossly normal. No evidence of mitral valve regurgitation.   4. The aortic valve is tricuspid. Aortic valve regurgitation is not visualized.   5. The inferior vena cava is dilated in size with >50% respiratory variability, suggesting right atrial pressure of 8 mmHg.   Microbiology  none  Antimicrobials: none    Objective: Vitals:   08/07/21 0338 08/07/21 0357 08/07/21 0729 08/07/21 0731  BP:  121/63  (!) 127/58  Pulse: 74 (!) 57 (!) 57 (!) 54  Resp: 17 16 15 15   Temp:  97.7 F (36.5 C)  (!) 97.3 F (36.3 C)  TempSrc:  Axillary    SpO2: 93% 99% 99% 97%  Weight:        Intake/Output Summary (Last 24 hours) at 08/07/2021 1134 Last data filed at 08/07/2021 0358 Gross per 24 hour  Intake --  Output 3075 ml  Net -3075 ml    Filed Weights   08/02/21 0532 08/05/21 1100 08/06/21 0432  Weight: (!) 250.5 kg (!) 234.5 kg (!) 237.1 kg    Examination:  Constitutional: NAD Respiratory: CTA Cardiovascular: RRR  Data Reviewed: I have independently reviewed following labs and imaging studies  CBC: Recent Labs  Lab 08/02/21 0301 08/04/21 0247  WBC 6.4 6.9  HGB 11.5* 11.7*  HCT 37.8* 38.8*  MCV 88.5 88.8  PLT 208 240    Basic Metabolic Panel: Recent Labs  Lab 08/02/21 0301 08/04/21 0247  NA 137 136  K 3.8 3.7  CL 99 97*  CO2 30 30  GLUCOSE 116* 137*  BUN 21* 15  CREATININE 1.19 1.11  CALCIUM 8.4* 8.5*    Liver Function Tests: No results for input(s): AST, ALT, ALKPHOS, BILITOT, PROT, ALBUMIN in the last 168 hours.  Coagulation Profile: No results for input(s): INR, PROTIME in the last 168 hours.  HbA1C: No results for input(s): HGBA1C in the last 72 hours. CBG: No results for input(s): GLUCAP in the last 168 hours.  No results found for this or any previous visit (from the past 240 hour(s)).     Radiology Studies: No results found.   08/06/21, MD, PhD [facility]  Between 7 am - 7 pm I am available, please contact me via Amion (for emergencies) or Securechat (non urgent messages)  Between 7 pm - 7 am I am not available, please contact night coverage MD/APP via Amion

## 2021-08-07 NOTE — Progress Notes (Signed)
Inpatient Rehab Admissions Coordinator:    I do not have insurance auth or a bed for this Pt on CIR today, but will follow for potential admit pending insurance auth and bed availability.  Megan Salon, MS, CCC-SLP Rehab Admissions Coordinator  [phone] (celll) [phone] (office)

## 2021-08-07 NOTE — Progress Notes (Signed)
Physical Therapy Treatment Patient Details Name: Hunter King MRN: [id number] DOB: 06/27/1966 Today's Date: 08/07/2021    History of Present Illness Pt is a 55yo male who presents to [facility] ED on 8/10 secondary to recent syncope on 8/9. Of note, was seen at [facility] ED 8/9 and evaluated for left knee pain but pt and family did not feel that syncopal episode was appropriately evaluated. Noted to be hypoxic. CTA of Chest showed mild diffuse ground-glass opacities suggestive of atelectasis or mild  edema. PMH: chronic lymphodema, DM, Morbid obesity, R knee surgery, bradycardia, anxiety, depression.    PT Comments    Pt eager to mobilize OOB, motivated to do anything to improve mobility. Pt ambulatory for short hallway distance, requires cues throughout for breathing technique and rest as needed. Pt tolerated repeated sit to stands during session, and encouraged pt to perform LAQs and ankle pumps while in chair. PT to continue to follow acutely.     Follow Up Recommendations  CIR (pending progression.  Pt may be able to d/c home directly.)     Equipment Recommendations  3in1 (PT);Other (comment);Hospital bed (bariatric rollator and bariatric bed side commode. bariatric hospital bed.)    Recommendations for Other Services       Precautions / Restrictions Precautions Precautions: Fall Precaution Comments: OCD; agoraphobic; skin breakdown Restrictions Weight Bearing Restrictions: No    Mobility  Bed Mobility Overal bed mobility: Needs Assistance Bed Mobility: Supine to Sit     Supine to sit: Supervision     General bed mobility comments: for safety, pt using railings to perform    Transfers Overall transfer level: Needs assistance Equipment used: 4-wheeled walker Transfers: Sit to/from Stand Sit to Stand: Min guard;From elevated surface         General transfer comment: for safety, slow to rise but no physical assist. STS x3, from EOB x1 and recliner  x2.  Ambulation/Gait Ambulation/Gait assistance: Min guard;+2 safety/equipment Gait Distance (Feet): 65 Feet Assistive device: 4-wheeled walker Gait Pattern/deviations: Step-through pattern;Decreased stride length;Trunk flexed;Drifts right/left Gait velocity: decr   General Gait Details: close guard for safety, cues for breathing technique when developing DOE 2/4. on 2LO2 via [place]   Stairs             Wheelchair Mobility    Modified Rankin (Stroke Patients Only)       Balance Overall balance assessment: Needs assistance Sitting-balance support: Bilateral upper extremity supported;Feet supported Sitting balance-Leahy Scale: Good       Standing balance-Leahy Scale: Fair                              Cognition Arousal/Alertness: Awake/alert Behavior During Therapy: Anxious;WFL for tasks assessed/performed Overall Cognitive Status: Within Functional Limits for tasks assessed                                 General Comments: motivated to progress mobility      Exercises General Exercises - Lower Extremity Ankle Circles/Pumps: AROM;Both;10 reps Long Arc Quad: AROM;Both;5 reps;Seated    General Comments General comments (skin integrity, edema, etc.): HR 89 bpm immediately post-ambulation, on 2LO2 via [place] throughout session      Pertinent Vitals/Pain Pain Assessment: Faces Faces Pain Scale: Hurts little more Pain Location: bilat knees (R knee arthritic, L knee from fall) Pain Descriptors / Indicators: Discomfort;Aching;Sore Pain Intervention(s): Limited activity within patient's tolerance;Monitored during  session;Repositioned    Home Living                      Prior Function            PT Goals (current goals can now be found in the care plan section) Acute Rehab PT Goals Patient Stated Goal: to do some rehab before going home; to not fall PT Goal Formulation: With patient Time For Goal Achievement: 08/17/21 Potential to  Achieve Goals: Good Progress towards PT goals: Progressing toward goals    Frequency    Min 3X/week      PT Plan Current plan remains appropriate    Co-evaluation              AM-PAC PT "6 Clicks" Mobility   Outcome Measure  Help needed turning from your back to your side while in a flat bed without using bedrails?: A Little Help needed moving from lying on your back to sitting on the side of a flat bed without using bedrails?: A Little Help needed moving to and from a bed to a chair (including a wheelchair)?: A Little Help needed standing up from a chair using your arms (e.g., wheelchair or bedside chair)?: A Little Help needed to walk in hospital room?: A Little Help needed climbing 3-5 steps with a railing? : A Little 6 Click Score: 18    End of Session Equipment Utilized During Treatment: Oxygen (2L via nasal cannula.) Activity Tolerance: Patient tolerated treatment well Patient left: with call bell/phone within reach;in chair;with chair alarm set Nurse Communication: Mobility status PT Visit Diagnosis: History of falling (Z91.81);Muscle weakness (generalized) (M62.81);Difficulty in walking, not elsewhere classified (R26.2)     Time: 4580-9983 PT Time Calculation (min) (ACUTE ONLY): 27 min  Charges:  $Gait Training: 8-22 mins $Therapeutic Activity: 8-22 mins                     Marye Round, PT DPT Acute Rehabilitation Services Pager [phone]  Office [phone]    Hunter King 08/07/2021, 1:46 PM

## 2021-08-07 NOTE — TOC Progression Note (Signed)
Transition of Care [facility]) - Progression Note    Patient Details  Name: Hunter King MRN: [id number] Date of Birth: December 29, 1965  Transition of Care [facility]) CM/SW Contact  Joanne Chars, LCSW Phone Number: 08/07/2021, 2:51 PM  Clinical Narrative:   Bethanne Ginger from [employer] met with pt regarding trilogy, answered questions, discussed copays. No barriers.  CSW spoke with pt regarding appeal for CIR authorization.  Discussed what we will do if the appeal is denied and pt is aware that return home with support would be the other option.  Pt reports his concern with home is that there is not enough space for his walker.  He is also working towards replacing the commode that he uses with a bariatric version that will work better.  CSW complimented pt on his progress with PT.  Pt upbeat about continued progress.      Expected Discharge Plan: IP Rehab Facility Barriers to Discharge: Other (must enter comment) (per CIR, not yet candidate based on how much assistance he is requiring)  Expected Discharge Plan and Services Expected Discharge Plan: [place]     Post Acute Care Choice: IP Rehab Living arrangements for the past 2 months: Single Family Home                                       Social Determinants of Health (SDOH) Interventions    Readmission Risk Interventions No flowsheet data found.

## 2021-08-08 DIAGNOSIS — J9601 Acute respiratory failure with hypoxia: Secondary | ICD-10-CM | POA: Diagnosis not present

## 2021-08-08 DIAGNOSIS — J9602 Acute respiratory failure with hypercapnia: Secondary | ICD-10-CM | POA: Diagnosis not present

## 2021-08-08 NOTE — Progress Notes (Signed)
Inpatient Rehab Admissions Coordinator:   Pt.'s insurance has denied our appeal for CIR admission. He will likely be able to d/c home with home health.   Megan Salon, MS, CCC-SLP Rehab Admissions Coordinator  [phone] (celll) [phone] (office)

## 2021-08-08 NOTE — TOC Progression Note (Signed)
Transition of Care (TOC) - Progression Note    Patient Details  Name: Hunter King MRN: [id number] Date of Birth: 10/11/1966  Transition of Care (TOC) CM/SW Contact  Wierda, Gregory Jon, LCSW Phone Number: 08/08/2021, 3:00 PM  Clinical Narrative:   CSW informed insurance appeal for CIR was denied.  CSW also informed by [employer] that insurance approval for Trilogy has not yet been received and could be several more days.    CSW met with pt and PT who were completing session.  We all discussed HH option, [facility] was in place prior to admission.  Discussed still waiting on Trilogy.  Pt upbeat about going home with HH, family is working on a ramp into the home and upgrading commode in master bedroom to bariatric size.  PT/OT working with pt every day and pt continues to make gains.  Discussed DME recommendations: pt does not want hospital bed but does want bariatric rollator and bedside commode.    Expected Discharge Plan: IP Rehab Facility Barriers to Discharge: Other (must enter comment) (per CIR, not yet candidate based on how much assistance he is requiring)  Expected Discharge Plan and Services Expected Discharge Plan: IP Rehab Facility     Post Acute Care Choice: IP Rehab Living arrangements for the past 2 months: Single Family Home                                       Social Determinants of Health (SDOH) Interventions    Readmission Risk Interventions No flowsheet data found.  

## 2021-08-08 NOTE — Progress Notes (Signed)
PROGRESS NOTE  Hunter King IEP:[id number] DOB: 06/22/1966 DOA: 07/26/2021 PCP: Avis Epley, PA-C   LOS: 12 days   Brief Narrative / Interim history: 55 year old with past medical history significant for anxiety, depression, lymphedema, morbid obesity presents after having a fall at home.  Patient fell at home and passed out.  That time patient was evaluated at [facility] they check x-ray of his knee and he was discharged home. He presented to the ED [facility] again for further evaluation of syncopal episode. Patient was also noted to be hypoxic down to 87 on room air.  Chest x-ray showed cardiomegaly.  CT angio negative for PE, showed mild diffuse groundglass opacities suggest atelectasis or mild edema.  Subjective / 24h Interval events: In bed, feels well.  Yesterday he worked with PT and walked outside his room, he tells me was the longest he is walked in several months, he was pretty tired when he got back but feels like he has made pretty good improvement  Assessment & Plan: Principal Problem Acute on chronic hypoxic hypercapnic respiratory failure in the setting of obesity hypoventilation syndrome, acute on chronic diastolic heart failure exacerbation as well as COPD with severe obstructive disease -initially diuresed with IV Lasix, now converted to po.  Net -38 L -He will need BiPAP at discharge.  Arranging trilogy, to be delivered to his room today -BiPAP at bedtime while in the hospital.  If trilogy arrives today with prefer to use that tonight -Spirometry done during his hospital stay showed severe obstructive disease -Monitor renal function -Weight improved from around 600 pounds on admission to currently at 517.  He feels lighter and much better overall -Appeal to the insurance company regarding CIR admission pending  Active Problems Syncope Likely Vasovagal.  CTA negative for PE. ECHO no aortic valve stenosis.    Lymphedema -chronic: Continue with wound  care   Elevated D dimer -CT negative for PE -[place] negative for DVT.    Anxiety, Depression -Continue with Zoloft and Mirtazapine.    Obesity, class III  -Needs life style modification.    Shoulder, knee pain-Continue with tylenol schedule for few days, Voltaren gel ordered.    Axilla intertriginous Dermatitis; started  Nystatin. Improved.   Scheduled Meds:  diclofenac Sodium  2 g Topical QID   furosemide  40 mg Oral BID   guaiFENesin  600 mg Oral BID   heparin injection (subcutaneous)  5,000 Units Subcutaneous Q8H   mirtazapine  15 mg Oral QHS   nystatin   Topical TID   sertraline  150 mg Oral Daily   sodium chloride flush  3 mL Intravenous Q12H   Continuous Infusions: PRN Meds:.acetaminophen **OR** acetaminophen, albuterol, loratadine, ondansetron **OR** ondansetron (ZOFRAN) IV, traMADol  Diet Orders (From admission, onward)     Start     Ordered   07/26/21 1725  Diet Heart Room service appropriate? Yes; Fluid consistency: Thin  Diet effective now       Question Answer Comment  Room service appropriate? Yes   Fluid consistency: Thin      07/26/21 1726            DVT prophylaxis: heparin injection 5,000 Units Start: 07/27/21 1400     Code Status: Full Code  Family Communication: no family at bedside  Status is: Inpatient  Remains inpatient appropriate because:Unsafe d/c plan and Inpatient level of care appropriate due to severity of illness  Dispo: The patient is from: Home  Anticipated d/c is to: SNF              Patient currently is medically stable to d/c.   Difficult to place patient No  Level of care: Telemetry Medical  Consultants:  none  Procedures:  2D echo:   1. Left ventricular ejection fraction, by estimation, is 65 to 70%. The left ventricle has normal function. The left ventricle has no regional wall motion abnormalities. There is moderate left ventricular hypertrophy. Left ventricular diastolic parameters are consistent with  Grade II diastolic dysfunction (pseudonormalization). Elevated left ventricular end-diastolic pressure. The E/e' is 15.   2. Right ventricular systolic function is normal. The right ventricular size is mildly enlarged. There is mildly elevated pulmonary artery systolic pressure. The estimated right ventricular systolic pressure is 44.7 mmHg.   3. The mitral valve is grossly normal. No evidence of mitral valve regurgitation.   4. The aortic valve is tricuspid. Aortic valve regurgitation is not visualized.   5. The inferior vena cava is dilated in size with >50% respiratory variability, suggesting right atrial pressure of 8 mmHg.   Microbiology  none  Antimicrobials: none    Objective: Vitals:   08/07/21 2121 08/07/21 2345 08/08/21 0511 08/08/21 1000  BP: (!) 124/59  139/69 123/89  Pulse: 64 65 62 74  Resp: 17 (!) 25 19 20   Temp: 97.9 F (36.6 C)  97.6 F (36.4 C) 98.3 F (36.8 C)  TempSrc: Oral  Oral Oral  SpO2: 98%  98% 98%  Weight:   (!) 234.3 kg     Intake/Output Summary (Last 24 hours) at 08/08/2021 1151 Last data filed at 08/07/2021 1345 Gross per 24 hour  Intake --  Output 725 ml  Net -725 ml    Filed Weights   08/05/21 1100 08/06/21 0432 08/08/21 0511  Weight: (!) 234.5 kg (!) 237.1 kg (!) 234.3 kg    Examination:  Constitutional: NAD Respiratory: CTA Cardiovascular: rrr  Data Reviewed: I have independently reviewed following labs and imaging studies  CBC: Recent Labs  Lab 08/02/21 0301 08/04/21 0247  WBC 6.4 6.9  HGB 11.5* 11.7*  HCT 37.8* 38.8*  MCV 88.5 88.8  PLT 208 240    Basic Metabolic Panel: Recent Labs  Lab 08/02/21 0301 08/04/21 0247  NA 137 136  K 3.8 3.7  CL 99 97*  CO2 30 30  GLUCOSE 116* 137*  BUN 21* 15  CREATININE 1.19 1.11  CALCIUM 8.4* 8.5*    Liver Function Tests: No results for input(s): AST, ALT, ALKPHOS, BILITOT, PROT, ALBUMIN in the last 168 hours.  Coagulation Profile: No results for input(s): INR, PROTIME in  the last 168 hours.  HbA1C: No results for input(s): HGBA1C in the last 72 hours. CBG: No results for input(s): GLUCAP in the last 168 hours.  No results found for this or any previous visit (from the past 240 hour(s)).     Radiology Studies: No results found.   08/06/21, MD, PhD [facility]  Between 7 am - 7 pm I am available, please contact me via Amion (for emergencies) or Securechat (non urgent messages)  Between 7 pm - 7 am I am not available, please contact night coverage MD/APP via Amion

## 2021-08-08 NOTE — Progress Notes (Signed)
Physical Therapy Treatment Patient Details Name: Hunter King MRN: [id number] DOB: 05-10-1966 Today's Date: 08/08/2021    History of Present Illness Pt is a 55yo male who presents to [facility] ED on 8/10 secondary to recent syncope on 8/9. Of note, was seen at [facility] ED 8/9 and evaluated for left knee pain but pt and family did not feel that syncopal episode was appropriately evaluated. Noted to be hypoxic. CTA of Chest showed mild diffuse ground-glass opacities suggestive of atelectasis or mild  edema. PMH: chronic lymphodema, DM, Morbid obesity, R knee surgery, bradycardia, anxiety, depression.    PT Comments    STAR PT session: Pt educated on increased therapy offered by [facility] program and pt enthusiastically agrees to full participation to be able to go home safely. During session CM informed pt that CIR appeal had been declined. Pt to work on list of goals for safe navigation of his home environment. Pt performed Berg Balance test and scored 32/56 which places him in the high fall risk category. Pt impressed with the level of mobility and balance he does have but agrees it needs improvement. Monitored SaO2 throughout session and pt able to participate in activity on RA with SaO2 dropping to 86-87%O2 mainly secondary to pt holding his breath during activity. With cuing for breathing pt able to maintain SaO2 in low 90%s. Pt will be in hospital for a few more days awaiting approval of Trilogy BiPAP for home. PT will work to maximize safe mobility in that time.       Follow Up Recommendations  Home health PT;Supervision for mobility/OOB     Equipment Recommendations  3in1 (PT);Rolling walker with 5" wheels (bariatric rollator and bariatric bed side commode.)       Precautions / Restrictions Precautions Precautions: Fall Precaution Comments: OCD; agoraphobic; skin breakdown Restrictions Weight Bearing Restrictions: No    Mobility  Bed Mobility Overal bed mobility: Needs Assistance Bed  Mobility: Supine to Sit     Supine to sit: Supervision Sit to supine: Supervision   General bed mobility comments: good sequencing to be able to bring LE into bed    Transfers Overall transfer level: Needs assistance Equipment used: 4-wheeled walker;None Transfers: Sit to/from [employer] Sit to Stand: Supervision Stand pivot transfers: Supervision       General transfer comment: Supervision for various sit to stands with and without UE support at bedside with Rollator and without. able to pivot to/from recliner without AD  Ambulation/Gait             General Gait Details: focus of session balance         Balance Overall balance assessment: Needs assistance Sitting-balance support: Bilateral upper extremity supported;Feet supported Sitting balance-Leahy Scale: Good Sitting balance - Comments: able to sit EOB without support and pick shoe up off ground   Standing balance support: During functional activity;No upper extremity supported Standing balance-Leahy Scale: Fair Standing balance comment: able to stand without support, pivot to chair without AD though benefits from UE support to offload knee pressure                 Standardized Balance Assessment Standardized Balance Assessment : Berg Balance Test Berg Balance Test Sit to Stand: Able to stand without using hands and stabilize independently Standing Unsupported: Able to stand safely 2 minutes Sitting with Back Unsupported but Feet Supported on Floor or Stool: Able to sit safely and securely 2 minutes Stand to Sit: Sits safely with minimal use of hands  Transfers: Able to transfer safely, definite need of hands Standing Unsupported with Eyes Closed: Able to stand 10 seconds safely Standing Ubsupported with Feet Together: Able to place feet together independently and stand for 1 minute with supervision From Standing, Reach Forward with Outstretched Arm: Can reach confidently >25 cm  (10") From Standing Position, Pick up Object from Floor: Unable to try/needs assist to keep balance From Standing Position, Turn to Look Behind Over each Shoulder: Turn sideways only but maintains balance Turn 360 Degrees: Needs assistance while turning Standing Unsupported, Alternately Place Feet on Step/Stool: Needs assistance to keep from falling or unable to try Standing Unsupported, One Foot in Front: Loses balance while stepping or standing Standing on One Leg: Unable to try or needs assist to prevent fall Total Score: 32        Cognition Arousal/Alertness: Awake/alert Behavior During Therapy: Anxious;WFL for tasks assessed/performed Overall Cognitive Status: Within Functional Limits for tasks assessed                                 General Comments: motivated to progress mobility, limited by anxiety at times         General Comments General comments (skin integrity, edema, etc.): HR WFL, SpO2 bouncing between 86-96% on RA. Improved with activity. Cues for breathing technique as pt tends to hold breath. Pt skilled at assessing SpO2 using personal pulse ox during session. Able to recall UE exercises without cues      Pertinent Vitals/Pain Pain Assessment: Faces Faces Pain Scale: Hurts little more Pain Location: bilat knees (R knee arthritic, L knee from fall) Pain Descriptors / Indicators: Discomfort;Aching;Sore Pain Intervention(s): Limited activity within patient's tolerance;Monitored during session;Repositioned     PT Goals (current goals can now be found in the care plan section) Acute Rehab PT Goals Patient Stated Goal: wean oxygen, be able to walk more PT Goal Formulation: With patient Time For Goal Achievement: 08/17/21 Potential to Achieve Goals: Good Progress towards PT goals: Progressing toward goals    Frequency    Min 3X/week      PT Plan Current plan remains appropriate    Co-evaluation   Reason for Co-Treatment: To address  functional/ADL transfers (initiate goals of STAR program) PT goals addressed during session: Balance;Mobility/safety with mobility OT goals addressed during session: ADL's and self-care;Proper use of Adaptive equipment and DME      AM-PAC PT "6 Clicks" Mobility   Outcome Measure  Help needed turning from your back to your side while in a flat bed without using bedrails?: None Help needed moving from lying on your back to sitting on the side of a flat bed without using bedrails?: A Little Help needed moving to and from a bed to a chair (including a wheelchair)?: A Little Help needed standing up from a chair using your arms (e.g., wheelchair or bedside chair)?: A Little Help needed to walk in hospital room?: A Little Help needed climbing 3-5 steps with a railing? : A Lot 6 Click Score: 18    End of Session   Activity Tolerance: Patient tolerated treatment well Patient left: with call bell/phone within reach;in chair;with chair alarm set Nurse Communication: Mobility status PT Visit Diagnosis: History of falling (Z91.81);Muscle weakness (generalized) (M62.81);Difficulty in walking, not elsewhere classified (R26.2)     Time: 5573-2202 PT Time Calculation (min) (ACUTE ONLY): 50 min  Charges:  $Therapeutic Activity: 8-22 mins $Physical Performance Test: 8-22 mins  Tanor Glaspy B. Beverely Risen PT, DPT Acute Rehabilitation Services Pager [phone] Office [phone]    Elon Alas [facility] 08/08/2021, 3:51 PM

## 2021-08-08 NOTE — Progress Notes (Signed)
Occupational Therapy Treatment Patient Details Name: Hunter King MRN: [id number] DOB: 1966-05-31 Today's Date: 08/08/2021    History of present illness Pt is a 55yo male who presents to [facility] ED on 8/10 secondary to recent syncope on 8/9. Of note, was seen at [facility] ED 8/9 and evaluated for left knee pain but pt and family did not feel that syncopal episode was appropriately evaluated. Noted to be hypoxic. CTA of Chest showed mild diffuse ground-glass opacities suggestive of atelectasis or mild  edema. PMH: chronic lymphodema, DM, Morbid obesity, R knee surgery, bradycardia, anxiety, depression.   OT comments  Pt seen for first STAR OT session. Session focused on establishing goals, barriers to address and solidifying OT POC in efforts to maximize independence in prep for DC home (CIR denied). Pt able to demo basic transfers without AD though feels more comfortable using Rollator to minimize B knee pain. Pt continues to require assist for LB ADLs due to difficulty reaching B feet and decreased endurance - education on AE already initiated during this admission. Plan to progress LB dressing via AE, progress endurance and ability to walk short distances without AD (not all doorways accessible at home) and manage toileting tasks independently in next sessions.  Trialed session on RA with lowest O2 reading 86%, sustained 89-90% at rest and noted at 96% after activity.   Initial Modified Barthel Index score for ADLs/mobility: 61/100   Follow Up Recommendations  Home health OT;Supervision - Intermittent    Equipment Recommendations  Other (comment) (bari Rollator)    Recommendations for Other Services      Precautions / Restrictions Precautions Precautions: Fall Precaution Comments: OCD; agoraphobic; skin breakdown Restrictions Weight Bearing Restrictions: No       Mobility Bed Mobility Overal bed mobility: Needs Assistance Bed Mobility: Supine to Sit     Supine to sit:  Supervision     General bed mobility comments: for safety, pt using railings to perform    Transfers Overall transfer level: Needs assistance Equipment used: 4-wheeled walker;None Transfers: Sit to/from [employer] Sit to Stand: Supervision Stand pivot transfers: Supervision       General transfer comment: Supervision for various sit to stands at bedside with Rollator and without. able to pivot to/from recliner without AD    Balance Overall balance assessment: Needs assistance Sitting-balance support: Bilateral upper extremity supported;Feet supported Sitting balance-Leahy Scale: Good     Standing balance support: During functional activity;No upper extremity supported Standing balance-Leahy Scale: Fair Standing balance comment: able to stand without support, pivot to chair without AD though benefits from UE support to offload knee pressure                           ADL either performed or assessed with clinical judgement   ADL Overall ADL's : Needs assistance/impaired                             Toileting- Clothing Manipulation and Hygiene: Moderate assistance;Sit to/from stand Toileting - Clothing Manipulation Details (indicate cue type and reason): assist for posterior hygiene in standing as smear noted on bed pad. Pt reports difficulty reaching posterior region (has methods that he uses at home for this task)       General ADL Comments: Session focused on PLOF, stratgies for LB ADLs. Pt has toileting tongs though reports difficulty reaching (has been attempting this task in bed with bedpan  due to bari [facility] low and anxious about ability to transfer off of [facility]).     Vision   Vision Assessment?: No apparent visual deficits   Perception     Praxis      Cognition Arousal/Alertness: Awake/alert Behavior During Therapy: Anxious;WFL for tasks assessed/performed Overall Cognitive Status: Within Functional Limits for tasks assessed                                  General Comments: motivated to progress mobility, limited by anxiety at times        Exercises     Shoulder Instructions       General Comments HR WFL, SpO2 bouncing between 86-96% on RA. Improved with activity. Cues for breathing technique as pt tends to hold breath. Pt skilled at assessing SpO2 using personal pulse ox during session. Able to recall UE exercises without cues    Pertinent Vitals/ Pain       Pain Assessment: Faces Faces Pain Scale: Hurts little more Pain Location: bilat knees (R knee arthritic, L knee from fall) Pain Descriptors / Indicators: Discomfort;Aching;Sore Pain Intervention(s): Monitored during session;Limited activity within patient's tolerance  Home Living                                          Prior Functioning/Environment              Frequency  Min 2X/week        Progress Toward Goals  OT Goals(current goals can now be found in the care plan section)  Progress towards OT goals: Progressing toward goals  Acute Rehab OT Goals Patient Stated Goal: wean oxygen, be able to walk more OT Goal Formulation: With patient Time For Goal Achievement: 08/10/21 Potential to Achieve Goals: Good ADL Goals Pt Will Perform Lower Body Bathing: with adaptive equipment;with supervision;with set-up Pt Will Perform Lower Body Dressing: with supervision;with set-up;with adaptive equipment;sit to/from stand Pt Will Transfer to Toilet: with modified independence;ambulating Pt Will Perform Toileting - Clothing Manipulation and hygiene: with modified independence;sit to/from stand;sitting/lateral leans;with adaptive equipment Pt/caregiver will Perform Home Exercise Program: Increased strength;Independently;With theraband;With written HEP provided Additional ADL Goal #1: Pt will verbalize 2 strateiges to manage MASD in skin folds and assist with care  Plan Discharge plan remains appropriate     Co-evaluation    PT/OT/SLP Co-Evaluation/Treatment: Yes Reason for Co-Treatment: To address functional/ADL transfers;For patient/therapist safety;Other (comment) (initiate goals of STAR program)   OT goals addressed during session: ADL's and self-care;Proper use of Adaptive equipment and DME      AM-PAC OT "6 Clicks" Daily Activity     Outcome Measure   Help from another person eating meals?: None Help from another person taking care of personal grooming?: A Little Help from another person toileting, which includes using toliet, bedpan, or urinal?: A Lot Help from another person bathing (including washing, rinsing, drying)?: A Lot Help from another person to put on and taking off regular upper body clothing?: A Little Help from another person to put on and taking off regular lower body clothing?: A Lot 6 Click Score: 16    End of Session Equipment Utilized During Treatment: Oxygen;Other (comment) (Rollator)  OT Visit Diagnosis: Other abnormalities of gait and mobility (R26.89);Muscle weakness (generalized) (M62.81);History of falling (Z91.81);Pain Pain - Right/Left: Left Pain - part of  body: Shoulder;Knee   Activity Tolerance Patient tolerated treatment well   Patient Left in bed;Other (comment) (sitting EOB withPT)   Nurse Communication Mobility status;Other (comment) (O2)        Time: 1505-6979 OT Time Calculation (min): 53 min  Charges: OT General Charges $OT Visit: 1 Visit OT Treatments $Self Care/Home Management : 8-22 mins $Therapeutic Activity: 8-22 mins  Bradd Canary, OTR/L Acute Rehab Services Office: [phone]    Lorre Munroe 08/08/2021, 2:37 PM

## 2021-08-09 DIAGNOSIS — J9602 Acute respiratory failure with hypercapnia: Secondary | ICD-10-CM | POA: Diagnosis not present

## 2021-08-09 DIAGNOSIS — J9601 Acute respiratory failure with hypoxia: Secondary | ICD-10-CM | POA: Diagnosis not present

## 2021-08-09 DIAGNOSIS — R55 Syncope and collapse: Secondary | ICD-10-CM | POA: Diagnosis not present

## 2021-08-09 LAB — BASIC METABOLIC PANEL
Anion gap: 8 (ref 5–15)
BUN: 17 mg/dL (ref 6–20)
CO2: 31 mmol/L (ref 22–32)
Calcium: 8.8 mg/dL — ABNORMAL LOW (ref 8.9–10.3)
Chloride: 98 mmol/L (ref 98–111)
Creatinine, Ser: 1.1 mg/dL (ref 0.61–1.24)
GFR, Estimated: >60 mL/min (ref >60)
Glucose, Bld: 126 mg/dL — ABNORMAL HIGH (ref 70–99)
Potassium: 3.6 mmol/L (ref 3.5–5.1)
Sodium: 137 mmol/L (ref 135–145)

## 2021-08-09 LAB — CBC
HCT: 40.7 % (ref 39.0–52.0)
Hemoglobin: 12.7 g/dL — ABNORMAL LOW (ref 13.0–17.0)
MCH: 27 pg (ref 26.0–34.0)
MCHC: 31.2 g/dL (ref 30.0–36.0)
MCV: 86.6 fL (ref 80.0–100.0)
Platelets: 252 10*3/uL (ref 150–400)
RBC: 4.7 MIL/uL (ref 4.22–5.81)
RDW: 15.7 % — ABNORMAL HIGH (ref 11.5–15.5)
WBC: 5.7 10*3/uL (ref 4.0–10.5)
nRBC: 0 % (ref 0.0–0.2)

## 2021-08-09 NOTE — TOC Progression Note (Signed)
Transition of Care [facility]) - Progression Note    Patient Details  Name: Hunter King MRN: [id number] Date of Birth: 1966-09-08  [facility] CM/SW Contact  Lorri Frederick, LCSW Phone Number: 08/09/2021, 2:34 PM  Clinical Narrative:   Per [employer], auth received for Trilogy.  CSW messaged MD who would like trilogy set up here to see how pt responds.  Messaged [employer] and they will come at 2pm for set up.  CSW spoke with Velna Hatchet at [employer] regarding need for bariatric rolling walker and bariatric bedside commode.  She will determine if they are available.    Expected Discharge Plan: IP Rehab Facility Barriers to Discharge: Other (must enter comment) (per CIR, not yet candidate based on how much assistance he is requiring)  Expected Discharge Plan and Services Expected Discharge Plan: IP Rehab Facility     Post Acute Care Choice: IP Rehab Living arrangements for the past 2 months: Single Family Home                                       Social Determinants of Health (SDOH) Interventions    Readmission Risk Interventions No flowsheet data found.

## 2021-08-09 NOTE — Progress Notes (Signed)
Physical Therapy Treatment Patient Details Name: Hunter King MRN: [id number] DOB: [birth year] Today's Date: 08/09/2021    History of Present Illness Pt is a 55yo male who presents to [facility] ED on 8/10 secondary to recent syncope on 8/9. Of note, was seen at [facility] ED 8/9 and evaluated for left knee pain but pt and family did not feel that syncopal episode was appropriately evaluated. Noted to be hypoxic. CTA of Chest showed mild diffuse ground-glass opacities suggestive of atelectasis or mild  edema. PMH: chronic lymphodema, DM, Morbid obesity, R knee surgery, bradycardia, anxiety, depression.    PT Comments    Pt just finished Trilogy training, somewhat apprehensive about using it long term. Discussed need for brain health. Pt and wife in agreement. Pt able to get up from flattened bed with use of bed rail and supervision.  Supervision for transfers. Pt able to ambulate total of 90 feet with 1xstanding and 1x seated rest break. Discussed goal setting and self awareness. Continue to work towards home discharge on Friday. Focus of session for tomorrow, steps.    Follow Up Recommendations  Home health PT;Supervision for mobility/OOB     Equipment Recommendations  3in1 (PT);Rolling walker with 5" wheels (bariatric rollator and bariatric bed side commode.)    Recommendations for Other Services       Precautions / Restrictions Precautions Precautions: Fall Precaution Comments: OCD; agoraphobic; skin breakdown Restrictions Weight Bearing Restrictions: No    Mobility  Bed Mobility Overal bed mobility: Needs Assistance Bed Mobility: Supine to Sit Rolling: Supervision   Supine to sit: Supervision     General bed mobility comments: cuing for bending knee opposite EoB and reaching across body    Transfers Overall transfer level: Needs assistance Equipment used: 4-wheeled walker;None Transfers: Sit to/from [employer] Sit to Stand: Supervision          General transfer comment: Supervision for various sit to stands with and without UE support at bedside with Rollator and without. able to pivot to/from recliner without AD  Ambulation/Gait Ambulation/Gait assistance: Min guard;+2 safety/equipment (chair follow) Gait Distance (Feet): 90 Feet (30-standing rest break 30 sitting break 30) Assistive device: 4-wheeled walker Gait Pattern/deviations: Step-through pattern;Decreased stride length;Trunk flexed;Drifts right/left Gait velocity: decr Gait velocity interpretation: <1.8 ft/sec, indicate of risk for recurrent falls General Gait Details: cues for breathing, and core activation         Balance Overall balance assessment: Needs assistance Sitting-balance support: Bilateral upper extremity supported;Feet supported Sitting balance-Leahy Scale: Good Sitting balance - Comments: able to sit EOB without support and pick shoe up off ground   Standing balance support: During functional activity;No upper extremity supported Standing balance-Leahy Scale: Fair Standing balance comment: able to stand without support                            Cognition Arousal/Alertness: Awake/alert Behavior During Therapy: Anxious;WFL for tasks assessed/performed Overall Cognitive Status: Within Functional Limits for tasks assessed                                 General Comments: motivated to progress mobility, limited by anxiety at times      Exercises General Exercises - Lower Extremity Ankle Circles/Pumps: AROM;Both;10 reps Long Arc Quad: AROM;Both;5 reps;Seated Hip Flexion/Marching: AROM;Both;10 reps;Standing Toe Raises: AROM;Both;10 reps;Seated    General Comments General comments (skin integrity, edema, etc.): SaO2 on RA >87%O2  even with 4/4 DoE, cuing for depth of breath and slowed exhale, wife present during session      Pertinent Vitals/Pain Pain Assessment: Faces Faces Pain Scale: Hurts little more Pain  Location: knees (R knee arthritic) low back Pain Descriptors / Indicators: Discomfort;Aching;Sore Pain Intervention(s): Limited activity within patient's tolerance;Monitored during session;Repositioned    Home Living                      Prior Function            PT Goals (current goals can now be found in the care plan section) Acute Rehab PT Goals Patient Stated Goal: walk and climb stairs PT Goal Formulation: With patient Time For Goal Achievement: 08/17/21 Potential to Achieve Goals: Good Progress towards PT goals: Progressing toward goals    Frequency    Min 3X/week      PT Plan Current plan remains appropriate    Co-evaluation              AM-PAC PT "6 Clicks" Mobility   Outcome Measure  Help needed turning from your back to your side while in a flat bed without using bedrails?: None Help needed moving from lying on your back to sitting on the side of a flat bed without using bedrails?: None Help needed moving to and from a bed to a chair (including a wheelchair)?: A Little Help needed standing up from a chair using your arms (e.g., wheelchair or bedside chair)?: A Little Help needed to walk in hospital room?: A Little Help needed climbing 3-5 steps with a railing? : A Lot 6 Click Score: 19    End of Session   Activity Tolerance: Patient tolerated treatment well Patient left: with call bell/phone within reach;in chair;with chair alarm set Nurse Communication: Mobility status PT Visit Diagnosis: History of falling (Z91.81);Muscle weakness (generalized) (M62.81);Difficulty in walking, not elsewhere classified (R26.2)     Time: 5009-3818 PT Time Calculation (min) (ACUTE ONLY): 45 min  Charges:  $Gait Training: 23-37 mins $Therapeutic Exercise: 8-22 mins                     Hunter King B. Beverely Risen PT, DPT Acute Rehabilitation Services Pager [phone] Office [phone]    Elon Alas [place] 08/09/2021, 4:53 PM

## 2021-08-09 NOTE — Progress Notes (Signed)
Occupational Therapy Treatment Patient Details Name: Hunter King MRN: [id number] DOB: 01/25/1966 Today's Date: 08/09/2021    History of present illness Pt is a 55yo male who presents to [facility] ED on 8/10 secondary to recent syncope on 8/9. Of note, was seen at [facility] ED 8/9 and evaluated for left knee pain but pt and family did not feel that syncopal episode was appropriately evaluated. Noted to be hypoxic. CTA of Chest showed mild diffuse ground-glass opacities suggestive of atelectasis or mild  edema. PMH: chronic lymphodema, DM, Morbid obesity, R knee surgery, bradycardia, anxiety, depression.   OT comments  STAR OT Session: Focus on toilet transfers, toileting hygiene and ADL endurance this AM. Pt able to successfully transfer to/from lower bariatric BSC in room without use of AD. Encouraged pt to complete [facility] transfers with staff and discontinue bedpan use. Discussed toileting hygiene techniques with pt considering bidet attachment at home. Due to difficulty with AE reach/BSC setup, pt will likely still require assist for pericare during admission. Pt able to demo some ADLs standing at sink and short mobility without AD thus increasing ability to access bathroom at home (doorways not DME-friendly). Plan to address LB dressing with AE, reinforce fall prevention and energy conservations in next session. Updated DME recs in consideration for energy conservation during showering tasks at home.   SpO2 down to 87% on RA at one point due to holding breath. Sustained 89-93% on RA with activity.   Follow Up Recommendations  Home health OT;Supervision - Intermittent    Equipment Recommendations  Other (comment) (Bariatric rollator, Bariatric BSC, possibly bariatric tub bench)    Recommendations for Other Services      Precautions / Restrictions Precautions Precautions: Fall Precaution Comments: OCD; agoraphobic; skin breakdown Restrictions Weight Bearing Restrictions: No       Mobility  Bed Mobility Overal bed mobility: Modified Independent Bed Mobility: Supine to Sit;Sit to Supine     Supine to sit: Modified independent (Device/Increase time);HOB elevated Sit to supine: Modified independent (Device/Increase time);HOB elevated   General bed mobility comments: Modified Independent with HOB elevated and use of bed rails. Discussed bed mob at home with pt reporting he typically sleeps on wall side, rolls on to stomach and slides off at foot of bed at home - will work to simulate that here    Transfers Overall transfer level: Needs assistance Equipment used: 4-wheeled walker;None Transfers: Sit to/from [employer] to Stand: Supervision Stand pivot transfers: Supervision       General transfer comment: Supervision for transfer to/from baric [facility] without AD, Sit to stand from [facility] with Rollator to mobilize to sink    Balance Overall balance assessment: Needs assistance Sitting-balance support: Bilateral upper extremity supported;Feet supported Sitting balance-Leahy Scale: Good     Standing balance support: During functional activity;No upper extremity supported Standing balance-Leahy Scale: Fair Standing balance comment: able to stand without support, pivot to chair without AD though benefits from UE support to offload knee pressure                           ADL either performed or assessed with clinical judgement   ADL Overall ADL's : Needs assistance/impaired     Grooming: Set up;Standing;Oral care Grooming Details (indicate cue type and reason): Setup standing without AD to brush teeth at sink, does lean on sink due to fatigue  Toilet Transfer: Administrator, sports Details (indicate cue type and reason): without use of AD. pt able to transfer on/off of lower bari BSC. Encouraged pt to use this with nursing staff           General ADL Comments: Discussed various techniques  for toileting hygiene/bathroom setup with pt typically using plunger handle with toilet paper, then wipes, then washcloths. Pt unable to successfully reach front to back with toilet tongs (not quite wide enough and BSC lip in way). Pt reports confident will be able to manage at home with current method and open front of bari commode at home. Discussed placement of grab bars for safety in transfers, use of bidet attachment on toilet. Discussed shower setup with pt fearful due to previous fall. Discussed walk in shower > tub shower for safety with use of bari tub bench vs baric BSC (for hole and ability to reach peri region).     Vision   Vision Assessment?: No apparent visual deficits   Perception     Praxis      Cognition Arousal/Alertness: Awake/alert Behavior During Therapy: WFL for tasks assessed/performed;Anxious Overall Cognitive Status: Within Functional Limits for tasks assessed                                 General Comments: motivated to progress mobility, limited by anxiety at times        Exercises     Shoulder Instructions       General Comments SpO2 down to 87% on RA when noted to hold breath. Sustained low 90s throughout with activity on RA    Pertinent Vitals/ Pain       Pain Assessment: Faces Faces Pain Scale: Hurts a little bit Pain Location: bilat knees (R knee arthritic, L knee from fall) Pain Descriptors / Indicators: Discomfort;Aching;Sore Pain Intervention(s): Monitored during session;Limited activity within patient's tolerance  Home Living                                          Prior Functioning/Environment              Frequency  Min 2X/week        Progress Toward Goals  OT Goals(current goals can now be found in the care plan section)  Progress towards OT goals: Progressing toward goals  Acute Rehab OT Goals Patient Stated Goal: wean oxygen, be able to walk more OT Goal Formulation: With  patient Time For Goal Achievement: 08/10/21 Potential to Achieve Goals: Good ADL Goals Pt Will Perform Lower Body Bathing: with adaptive equipment;with supervision;with set-up Pt Will Perform Lower Body Dressing: with supervision;with set-up;with adaptive equipment;sit to/from stand Pt Will Transfer to Toilet: with modified independence;ambulating Pt Will Perform Toileting - Clothing Manipulation and hygiene: with modified independence;sit to/from stand;sitting/lateral leans;with adaptive equipment Pt/caregiver will Perform Home Exercise Program: Increased strength;Independently;With theraband;With written HEP provided Additional ADL Goal #1: Pt will verbalize 2 strateiges to manage MASD in skin folds and assist with care  Plan Discharge plan remains appropriate    Co-evaluation                 AM-PAC OT "6 Clicks" Daily Activity     Outcome Measure   Help from another person eating meals?: None Help from another person taking care of personal grooming?: A Little Help  from another person toileting, which includes using toliet, bedpan, or urinal?: A Lot Help from another person bathing (including washing, rinsing, drying)?: A Lot Help from another person to put on and taking off regular upper body clothing?: A Little Help from another person to put on and taking off regular lower body clothing?: A Lot 6 Click Score: 16    End of Session Equipment Utilized During Treatment: Oxygen;Other (comment) (bari rollator)  OT Visit Diagnosis: Other abnormalities of gait and mobility (R26.89);Muscle weakness (generalized) (M62.81);History of falling (Z91.81);Pain Pain - Right/Left: Left Pain - part of body: Shoulder;Knee   Activity Tolerance Patient tolerated treatment well   Patient Left in bed;with call bell/phone within reach   Nurse Communication          Time: 5631-4970 OT Time Calculation (min): 53 min  Charges: OT General Charges $OT Visit: 1 Visit OT Treatments $Self  Care/Home Management : 23-37 mins $Therapeutic Activity: 8-22 mins  Hunter King, OTR/L Acute Rehab Services Office: [phone]    Lorre Munroe 08/09/2021, 12:47 PM

## 2021-08-09 NOTE — Progress Notes (Signed)
Pt removed from BIPAP and placed on 2L [place].

## 2021-08-09 NOTE — Progress Notes (Addendum)
Patient placed himself on home BiPAP at this time. 2L oxygen bled in. Advised patient if he needed any assistance throughout the night to call RT.

## 2021-08-09 NOTE — Progress Notes (Signed)
PROGRESS NOTE  Hunter King TKW:[id number] DOB: 1966-02-01 DOA: 07/26/2021 PCP: Avis Epley, PA-C   LOS: 13 days   Brief Narrative / Interim history:  55 year old with past medical history significant for anxiety, depression, lymphedema, morbid obesity presents after having a fall at home.  Patient fell at home and passed out.  That time patient was evaluated at [facility] they check x-ray of his knee and he was discharged home. He presented to the ED [facility] again for further evaluation of syncopal episode. Patient was also noted to be hypoxic down to 87 on room air.  Chest x-ray showed cardiomegaly.  CT angio negative for PE, showed mild diffuse groundglass opacities suggest atelectasis or mild edema.  Subjective / 24h Interval events:  Reports he was able to tolerate BiPAP overnight, reports he has been compliant with incentive spirometer, he is working with PT.   Assessment & Plan:  Acute on chronic hypoxic hypercapnic respiratory failure in the setting of obesity hypoventilation syndrome, acute on chronic diastolic heart failure exacerbation as well as COPD with severe obstructive disease -initially diuresed with IV Lasix, now converted to po.  Net -40 L -He will need BiPAP at discharge.  Arranging trilogy, likely to be delivered today, will have patient tried overnight. -Spirometry done during his hospital stay showed severe obstructive disease -Monitor renal function -Weight improved from around 600 pounds on admission to currently at 517.  He feels lighter and much better overall -He was encouraged to ambulate, get out of bed to chair, and use incentive spirometer.  Active Problems Syncope Likely Vasovagal.  CTA negative for PE. ECHO no aortic valve stenosis.    Lymphedema -chronic: Continue with wound care   Elevated D dimer -CT negative for PE -[place] negative for DVT.    Anxiety, Depression -Continue with Zoloft and Mirtazapine.    Obesity, class  III  -Needs life style modification.    Shoulder, knee pain-Continue with tylenol schedule for few days, Voltaren gel ordered.    Axilla intertriginous Dermatitis; started  Nystatin. Improved.   Scheduled Meds:  diclofenac Sodium  2 g Topical QID   furosemide  40 mg Oral BID   guaiFENesin  600 mg Oral BID   heparin injection (subcutaneous)  5,000 Units Subcutaneous Q8H   mirtazapine  15 mg Oral QHS   nystatin   Topical TID   sertraline  150 mg Oral Daily   sodium chloride flush  3 mL Intravenous Q12H   Continuous Infusions: PRN Meds:.acetaminophen **OR** acetaminophen, albuterol, loratadine, ondansetron **OR** ondansetron (ZOFRAN) IV, traMADol  Diet Orders (From admission, onward)     Start     Ordered   07/26/21 1725  Diet Heart Room service appropriate? Yes; Fluid consistency: Thin  Diet effective now       Question Answer Comment  Room service appropriate? Yes   Fluid consistency: Thin      07/26/21 1726            DVT prophylaxis: heparin injection 5,000 Units Start: 07/27/21 1400     Code Status: Full Code  Family Communication: no family at bedside  Status is: Inpatient  Remains inpatient appropriate because:Unsafe d/c plan and Inpatient level of care appropriate due to severity of illness  Dispo: The patient is from: Home              Anticipated d/c is to: SNF              Patient currently is medically stable  to d/c.   Difficult to place patient No  Level of care: Telemetry Medical  Consultants:  PCCM  Procedures:  2D echo:   1. Left ventricular ejection fraction, by estimation, is 65 to 70%. The left ventricle has normal function. The left ventricle has no regional wall motion abnormalities. There is moderate left ventricular hypertrophy. Left ventricular diastolic parameters are consistent with Grade II diastolic dysfunction (pseudonormalization). Elevated left ventricular end-diastolic pressure. The E/e' is 15.   2. Right ventricular systolic  function is normal. The right ventricular size is mildly enlarged. There is mildly elevated pulmonary artery systolic pressure. The estimated right ventricular systolic pressure is 44.7 mmHg.   3. The mitral valve is grossly normal. No evidence of mitral valve regurgitation.   4. The aortic valve is tricuspid. Aortic valve regurgitation is not visualized.   5. The inferior vena cava is dilated in size with >50% respiratory variability, suggesting right atrial pressure of 8 mmHg.   Microbiology  none  Antimicrobials: none    Objective: Vitals:   08/08/21 2021 08/08/21 2322 08/09/21 0535 08/09/21 0742  BP: (!) 102/50  (!) 146/66   Pulse: 80 81 69 72  Resp: 19 (!) 25 15 16   Temp: 97.8 F (36.6 C)  97.6 F (36.4 C)   TempSrc:      SpO2: 93% 98% 97% 95%  Weight:        Intake/Output Summary (Last 24 hours) at 08/09/2021 1414 Last data filed at 08/09/2021 1400 Gross per 24 hour  Intake --  Output 3025 ml  Net -3025 ml   Filed Weights   08/05/21 1100 08/06/21 0432 08/08/21 0511  Weight: (!) 234.5 kg (!) 237.1 kg (!) 234.3 kg    Examination:  Awake Alert, Oriented X 3, No new F.N deficits, Normal affect Symmetrical Chest wall movement,CTAB ant RRR,No Gallops +ve B.Sounds,  No Cyanosis, chronic lower extremity lymphedema and skin changes.   Data Reviewed: I have independently reviewed following labs and imaging studies  CBC: Recent Labs  Lab 08/04/21 0247 08/09/21 0317  WBC 6.9 5.7  HGB 11.7* 12.7*  HCT 38.8* 40.7  MCV 88.8 86.6  PLT 240 252   Basic Metabolic Panel: Recent Labs  Lab 08/04/21 0247 08/09/21 0317  NA 136 137  K 3.7 3.6  CL 97* 98  CO2 30 31  GLUCOSE 137* 126*  BUN 15 17  CREATININE 1.11 1.10  CALCIUM 8.5* 8.8*   Liver Function Tests: No results for input(s): AST, ALT, ALKPHOS, BILITOT, PROT, ALBUMIN in the last 168 hours.  Coagulation Profile: No results for input(s): INR, PROTIME in the last 168 hours.  HbA1C: No results for  input(s): HGBA1C in the last 72 hours. CBG: No results for input(s): GLUCAP in the last 168 hours.  No results found for this or any previous visit (from the past 240 hour(s)).     Radiology Studies: No results found.   08/11/21 MD [facility]  Between 7 am - 7 pm I am available, please contact me via Amion (for emergencies) or Securechat (non urgent messages)  Between 7 pm - 7 am I am not available, please contact night coverage MD/APP via Amion

## 2021-08-10 DIAGNOSIS — R55 Syncope and collapse: Secondary | ICD-10-CM | POA: Diagnosis not present

## 2021-08-10 DIAGNOSIS — J9601 Acute respiratory failure with hypoxia: Secondary | ICD-10-CM | POA: Diagnosis not present

## 2021-08-10 DIAGNOSIS — J9602 Acute respiratory failure with hypercapnia: Secondary | ICD-10-CM | POA: Diagnosis not present

## 2021-08-10 NOTE — Plan of Care (Signed)

## 2021-08-10 NOTE — Progress Notes (Signed)
PROGRESS NOTE  Hunter King QVZ:[id number] DOB: 11-01-1966 DOA: 07/26/2021 PCP: Avis Epley, PA-C   LOS: 14 days   Brief Narrative / Interim history:  55 year old with past medical history significant for anxiety, depression, lymphedema, morbid obesity presents after having a fall at home.  Patient fell at home and passed out.  That time patient was evaluated at [facility] they check x-ray of his knee and he was discharged home. He presented to the ED [facility] again for further evaluation of syncopal episode. Patient was also noted to be hypoxic down to 87 on room air.  Chest x-ray showed cardiomegaly.  CT angio negative for PE, showed mild diffuse groundglass opacities suggest atelectasis or mild edema.  Subjective / 24h Interval events:  Events overnight, was able to tolerate trilogy overnight, reportedly ambulated with PT/OT yesterday.     Assessment & Plan:  Acute on chronic hypoxic hypercapnic respiratory failure in the setting of obesity hypoventilation syndrome, acute on chronic diastolic heart failure exacerbation as well as COPD with severe obstructive disease -initially diuresed with IV Lasix, now converted to po.  Net -42 L -He will need BiPAP at discharge.  Arranging trilogy, likely to be delivered today, will have patient tried overnight. -Spirometry done during his hospital stay showed severe obstructive disease -Monitor renal function -Weight improved from around 600 pounds on admission to currently at 516.  He feels lighter and much better overall -He was encouraged to ambulate, get out of bed to chair, and use incentive spirometer.  Ambulation has been improving, is working with PT/OT, will continue with physical activity today hoping he will be ready for discharge tomorrow.  Active Problems Syncope Likely Vasovagal.  CTA negative for PE. ECHO no aortic valve stenosis.    Lymphedema -chronic: Continue with wound care   Elevated D dimer -CT  negative for PE -[place] negative for DVT.    Anxiety, Depression -Continue with Zoloft and Mirtazapine.    Obesity, class III  -Needs life style modification.    Shoulder, knee pain-Continue with tylenol schedule for few days, Voltaren gel ordered.    Axilla intertriginous Dermatitis; started  Nystatin. Improved.   Scheduled Meds:  diclofenac Sodium  2 g Topical QID   furosemide  40 mg Oral BID   guaiFENesin  600 mg Oral BID   heparin injection (subcutaneous)  5,000 Units Subcutaneous Q8H   mirtazapine  15 mg Oral QHS   nystatin   Topical TID   sertraline  150 mg Oral Daily   sodium chloride flush  3 mL Intravenous Q12H   Continuous Infusions: PRN Meds:.acetaminophen **OR** acetaminophen, albuterol, loratadine, ondansetron **OR** ondansetron (ZOFRAN) IV, traMADol  Diet Orders (From admission, onward)     Start     Ordered   07/26/21 1725  Diet Heart Room service appropriate? Yes; Fluid consistency: Thin  Diet effective now       Question Answer Comment  Room service appropriate? Yes   Fluid consistency: Thin      07/26/21 1726            DVT prophylaxis: heparin injection 5,000 Units Start: 07/27/21 1400     Code Status: Full Code  Family Communication: no family at bedside  Status is: Inpatient  Remains inpatient appropriate because:Unsafe d/c plan and Inpatient level of care appropriate due to severity of illness  Dispo: The patient is from: Home              Anticipated d/c is to: SNF  Patient currently is medically stable to d/c.   Difficult to place patient No  Level of care: Telemetry Medical  Consultants:  PCCM  Procedures:  2D echo:   1. Left ventricular ejection fraction, by estimation, is 65 to 70%. The left ventricle has normal function. The left ventricle has no regional wall motion abnormalities. There is moderate left ventricular hypertrophy. Left ventricular diastolic parameters are consistent with Grade II diastolic dysfunction  (pseudonormalization). Elevated left ventricular end-diastolic pressure. The E/e' is 15.   2. Right ventricular systolic function is normal. The right ventricular size is mildly enlarged. There is mildly elevated pulmonary artery systolic pressure. The estimated right ventricular systolic pressure is 44.7 mmHg.   3. The mitral valve is grossly normal. No evidence of mitral valve regurgitation.   4. The aortic valve is tricuspid. Aortic valve regurgitation is not visualized.   5. The inferior vena cava is dilated in size with >50% respiratory variability, suggesting right atrial pressure of 8 mmHg.   Microbiology  none  Antimicrobials: none    Objective: Vitals:   08/09/21 0742 08/09/21 1751 08/09/21 1900 08/10/21 0415  BP:  (!) 135/55 138/65 125/61  Pulse: 72 68 79 66  Resp: 16 18 19 16   Temp:  97.8 F (36.6 C) 98.4 F (36.9 C)   TempSrc:  Oral Oral   SpO2: 95% 90% 97% 91%  Weight:        Intake/Output Summary (Last 24 hours) at 08/10/2021 1343 Last data filed at 08/10/2021 08/12/2021 Gross per 24 hour  Intake --  Output 2680 ml  Net -2680 ml   Filed Weights   08/05/21 1100 08/06/21 0432 08/08/21 0511  Weight: (!) 234.5 kg (!) 237.1 kg (!) 234.3 kg    Examination:  Awake Alert, Oriented X 3, No new F.N deficits, Normal affect Symmetrical Chest wall movement, clear to auscultation bilaterally anteriorly RRR,No Gallops,Rubs or new Murmurs, No Parasternal Heave +ve B.Sounds, Abd Soft Chronic lower extremity lymphedema with skin changes.    Data Reviewed: I have independently reviewed following labs and imaging studies  CBC: Recent Labs  Lab 08/04/21 0247 08/09/21 0317  WBC 6.9 5.7  HGB 11.7* 12.7*  HCT 38.8* 40.7  MCV 88.8 86.6  PLT 240 252   Basic Metabolic Panel: Recent Labs  Lab 08/04/21 0247 08/09/21 0317  NA 136 137  K 3.7 3.6  CL 97* 98  CO2 30 31  GLUCOSE 137* 126*  BUN 15 17  CREATININE 1.11 1.10  CALCIUM 8.5* 8.8*   Liver Function Tests: No  results for input(s): AST, ALT, ALKPHOS, BILITOT, PROT, ALBUMIN in the last 168 hours.  Coagulation Profile: No results for input(s): INR, PROTIME in the last 168 hours.  HbA1C: No results for input(s): HGBA1C in the last 72 hours. CBG: No results for input(s): GLUCAP in the last 168 hours.  No results found for this or any previous visit (from the past 240 hour(s)).     Radiology Studies: No results found.   08/11/21 MD [facility]  Between 7 am - 7 pm I am available, please contact me via Amion (for emergencies) or Securechat (non urgent messages)  Between 7 pm - 7 am I am not available, please contact night coverage MD/APP via Amion

## 2021-08-10 NOTE — TOC Progression Note (Signed)
Transition of Care [facility]) - Progression Note    Patient Details  Name: DRAYKE GRABEL MRN: [id number] Date of Birth: 1966/09/26  [facility] CM/SW Contact  Lorri Frederick, LCSW Phone Number: 08/10/2021, 2:08 PM  Clinical Narrative:    Pt needed several DME changes--rollator vs walker, drop arm bedside commode.  Completed.  Home O2 in room.  Rollator will need to be delivered to home.    [facility] at [employer] notified of DC tomorrow.  CSW received call from sister asking for delay in DC until Monday.  Ramp is under construction but not done, commode is also going to be changed but not yet.  CSW spoke with MD who confirmed cannot hold pt until Monday, sister informed.    CSW spoke with pt in room, he is very upbeat.  Discussed PTAR transport home, can get him up stairs if no ramp.  Ready for DC tomorrow.  Quite happy with the trilogy.       Expected Discharge Plan: IP Rehab Facility Barriers to Discharge: Other (must enter comment) (per CIR, not yet candidate based on how much assistance he is requiring)  Expected Discharge Plan and Services Expected Discharge Plan: IP Rehab Facility     Post Acute Care Choice: IP Rehab Living arrangements for the past 2 months: Single Family Home                                       Social Determinants of Health (SDOH) Interventions    Readmission Risk Interventions No flowsheet data found.

## 2021-08-10 NOTE — Progress Notes (Signed)
Occupational Therapy Treatment Patient Details Name: Hunter King MRN: [id number] DOB: 01/10/1966 Today's Date: 08/10/2021    History of present illness Pt is a 55yo male who presents to [facility] ED on 8/10 secondary to recent syncope on 8/9. Of note, was seen at [facility] ED 8/9 and evaluated for left knee pain but pt and family did not feel that syncopal episode was appropriately evaluated. Noted to be hypoxic. CTA of Chest showed mild diffuse ground-glass opacities suggestive of atelectasis or mild  edema. PMH: chronic lymphodema, DM, Morbid obesity, R knee surgery, bradycardia, anxiety, depression.   OT comments  STAR OT Session: Pt received on bedpan. OT reinforced ability for other toileting options to maximize independence though pt reports fearful of bowel incontinence and ,/ordelayed staff response. Pt required Total A for cleanup after bed pan use. Guided pt in LB dressing tasks and education of AE (reacher, sock aid) with pt able to return demo well with Setup assist. Provided handouts/education on: fall prevention, energy conservation, UE HEP exercises, breathing exercises and AE reference handout. Plan to address bathroom mobility without AD to simulate home environment in next session and reinforce education as pt with planned DC tomorrow.   Noted incorrect DME delivered to pt room - SW aware and working to ensure pt has correct DME prior to DC.  SpO2 96% and above on RA.   Follow Up Recommendations  Home health OT;Supervision - Intermittent    Equipment Recommendations  Other (comment) (Bariatric rollator, bariatric BSC (drop arm would be helpful))    Recommendations for Other Services      Precautions / Restrictions Precautions Precautions: Fall Precaution Comments: OCD; agoraphobic; skin breakdown; monitor O2 Restrictions Weight Bearing Restrictions: No       Mobility Bed Mobility Overal bed mobility: Modified Independent Bed Mobility: Supine to Sit;Sit to Supine      Supine to sit: Modified independent (Device/Increase time) Sit to supine: Modified independent (Device/Increase time)   General bed mobility comments: assist to deflate bed only    Transfers Overall transfer level: Modified independent Equipment used: None;Rolling walker (2 wheeled) Transfers: Sit to/from Stand Sit to Stand: Modified independent (Device/Increase time)         General transfer comment: use of rollator vs no AD at bedside for LB ADLs    Balance Overall balance assessment: Needs assistance Sitting-balance support: Bilateral upper extremity supported;Feet supported Sitting balance-Leahy Scale: Good     Standing balance support: During functional activity;No upper extremity supported Standing balance-Leahy Scale: Good Standing balance comment: able to stand without support and ambulate short distances. Stability improves with UE support                           ADL either performed or assessed with clinical judgement   ADL Overall ADL's : Needs assistance/impaired                     Lower Body Dressing: Set up;Sit to/from stand;With adaptive equipment Lower Body Dressing Details (indicate cue type and reason): Guided pt in LB dressing to don underwear with pt able to do so by dropping underwear on floor, stepping into leg holes and using reacher to bring up to knees. No LOB, increased pain or safety concerns with this. Educated on use of bari sock aide for donning socks withpt able to return demo after initial education - excited about this AE and how easy it made this task.  Toileting- Clothing Manipulation and Hygiene: Total assistance;Bed level Toileting - Clothing Manipulation Details (indicate cue type and reason): On entry, pt on bedpan. Inquired with pt why he did not transfer to [facility] as practiced yesterday. Pt reports delayed staff response and urgency, worried about bowel incontinence while waiting for assist (to deflate bed,  etc). Total A for peri care hygiene bed level.       General ADL Comments: Provided handout on fall prevention strategies (pt anxious about falls at home), energy conservation, breathing exercises (pt tendency to hold breath during activities thus making O2 drop), AE handout for reference, and UE HEP to improve exercise recall.     Vision   Vision Assessment?: No apparent visual deficits   Perception     Praxis      Cognition Arousal/Alertness: Awake/alert Behavior During Therapy: Anxious;WFL for tasks assessed/performed Overall Cognitive Status: Within Functional Limits for tasks assessed                                 General Comments: motivated to progress mobility, limited by anxiety at times        Exercises     Shoulder Instructions       General Comments SpO2 96% and above on RA with activity, cues still needed to avoid holding breath with activity. Noted bari RW and regular BSC delivered to pt room - notified SW of incorrect DME    Pertinent Vitals/ Pain       Pain Assessment: Faces Faces Pain Scale: Hurts a little bit Pain Location: knees (R knee arthritic) Pain Descriptors / Indicators: Discomfort;Aching Pain Intervention(s): Monitored during session  Home Living                                          Prior Functioning/Environment              Frequency  Min 2X/week        Progress Toward Goals  OT Goals(current goals can now be found in the care plan section)  Progress towards OT goals: Progressing toward goals  Acute Rehab OT Goals Patient Stated Goal: walk and climb stairs OT Goal Formulation: With patient Time For Goal Achievement: 08/10/21 Potential to Achieve Goals: Good ADL Goals Pt Will Perform Lower Body Bathing: with adaptive equipment;with supervision;with set-up Pt Will Perform Lower Body Dressing: with supervision;with set-up;with adaptive equipment;sit to/from stand Pt Will Transfer to  Toilet: with modified independence;ambulating Pt Will Perform Toileting - Clothing Manipulation and hygiene: with modified independence;sit to/from stand;sitting/lateral leans;with adaptive equipment Pt/caregiver will Perform Home Exercise Program: Increased strength;Independently;With theraband;With written HEP provided Additional ADL Goal #1: Pt will verbalize 2 strateiges to manage MASD in skin folds and assist with care  Plan Discharge plan remains appropriate    Co-evaluation                 AM-PAC OT "6 Clicks" Daily Activity     Outcome Measure   Help from another person eating meals?: None Help from another person taking care of personal grooming?: A Little Help from another person toileting, which includes using toliet, bedpan, or urinal?: A Lot Help from another person bathing (including washing, rinsing, drying)?: A Lot Help from another person to put on and taking off regular upper body clothing?: A Little Help from another person to  put on and taking off regular lower body clothing?: A Little 6 Click Score: 17    End of Session Equipment Utilized During Treatment: Other (comment) (bari rollator)  OT Visit Diagnosis: Other abnormalities of gait and mobility (R26.89);Muscle weakness (generalized) (M62.81);History of falling (Z91.81);Pain Pain - Right/Left: Left Pain - part of body: Knee   Activity Tolerance Patient tolerated treatment well   Patient Left in bed;with call bell/phone within reach   Nurse Communication Mobility status        Time: 3557-3220 OT Time Calculation (min): 62 min  Charges: OT General Charges $OT Visit: 1 Visit OT Treatments $Self Care/Home Management : 23-37 mins $Therapeutic Activity: 23-37 mins  Bradd Canary, OTR/L Acute Rehab Services Office: [phone]    Lorre Munroe 08/10/2021, 12:37 PM

## 2021-08-10 NOTE — Progress Notes (Signed)
Physical Therapy Treatment Patient Details Name: Hunter King MRN: [id number] DOB: 01-03-1966 Today's Date: 08/10/2021    History of Present Illness Pt is a 55yo male who presents to [facility] ED on 8/10 secondary to recent syncope on 8/9. Of note, was seen at [facility] ED 8/9 and evaluated for left knee pain but pt and family did not feel that syncopal episode was appropriately evaluated. Noted to be hypoxic. CTA of Chest showed mild diffuse ground-glass opacities suggestive of atelectasis or mild  edema. PMH: chronic lymphodema, DM, Morbid obesity, R knee surgery, bradycardia, anxiety, depression.    PT Comments    Pt eager to see therapist and work on mobility. Focus of session stepping up step and building overall endurance. Discussed breaking up activities into smaller pieces to be able to achieve in a sustainable way. Pt able to navigate bed with mod I, perform sit>stand with supervision, and walk in room without AD. Pt attempted 8" step x 3 able to offweight R foot but not bring to step surface. With 6" step pt able to achieve up and down x4 and then again x5. Pt looking forward to discharge home tomorrow. Family is preparing house today. Pt will need PTAR transport home if ramp is not built in time. Pt in agreement.      Follow Up Recommendations  Home health PT;Supervision for mobility/OOB     Equipment Recommendations  3in1 (PT);Rolling walker with 5" wheels (bariatric rollator and bariatric bed side commode.)       Precautions / Restrictions Precautions Precautions: Fall Precaution Comments: OCD; agoraphobic; skin breakdown Restrictions Weight Bearing Restrictions: No    Mobility  Bed Mobility Overal bed mobility: Needs Assistance Bed Mobility: Supine to Sit     Supine to sit: Modified independent (Device/Increase time) Sit to supine: Modified independent (Device/Increase time)   General bed mobility comments: use of bed deflated and bed rails to get up and back down in  a flattened bed    Transfers Overall transfer level: Needs assistance Equipment used: 4-wheeled walker;None Transfers: Sit to/from [employer] Sit to Stand: Modified independent (Device/Increase time)         General transfer comment: pt able to sit>stand from bed at least 5 times without use of hands or AD  Ambulation/Gait Ambulation/Gait assistance: Min guard;Supervision Gait Distance (Feet): 10 Feet (x5+) Assistive device: 4-wheeled walker;None Gait Pattern/deviations: Step-through pattern;Decreased stride length;Trunk flexed;Drifts right/left Gait velocity: decr Gait velocity interpretation: <1.8 ft/sec, indicate of risk for recurrent falls General Gait Details: cues for breathing,   Stairs Stairs: Yes Stairs assistance: Min guard Stair Management: Step to pattern;Sideways Number of Stairs: 1 (8" step x3, 6 inch step x9) General stair comments: utilized single step initially 8" but too high and then 6" utilizing sink to provide UE support         Balance Overall balance assessment: Needs assistance Sitting-balance support: Bilateral upper extremity supported;Feet supported Sitting balance-Leahy Scale: Normal Sitting balance - Comments: able to sit EoB and pick object off floor on L and R   Standing balance support: During functional activity;No upper extremity supported Standing balance-Leahy Scale: Good Standing balance comment: able to stand without support                            Cognition Arousal/Alertness: Awake/alert Behavior During Therapy: WFL for tasks assessed/performed (less anxious about mobility tasks) Overall Cognitive Status: Within Functional Limits for tasks assessed  General Comments: motivated to progress mobility, limited by anxiety at times         General Comments General comments (skin integrity, edema, etc.): VSS on RA, drop in SaO2 to high 80s with activity  quickly able to recover with purse lip breating      Pertinent Vitals/Pain Pain Assessment: Faces Faces Pain Scale: Hurts a little bit Pain Location: knees (R knee arthritic) low back Pain Descriptors / Indicators: Discomfort;Aching;Sore Pain Intervention(s): Limited activity within patient's tolerance;Monitored during session;Repositioned     PT Goals (current goals can now be found in the care plan section) Acute Rehab PT Goals Patient Stated Goal: walk and climb stairs PT Goal Formulation: With patient Time For Goal Achievement: 08/17/21 Potential to Achieve Goals: Good Progress towards PT goals: Progressing toward goals    Frequency    Min 3X/week      PT Plan Current plan remains appropriate       AM-PAC PT "6 Clicks" Mobility   Outcome Measure  Help needed turning from your back to your side while in a flat bed without using bedrails?: None Help needed moving from lying on your back to sitting on the side of a flat bed without using bedrails?: None Help needed moving to and from a bed to a chair (including a wheelchair)?: A Little Help needed standing up from a chair using your arms (e.g., wheelchair or bedside chair)?: A Little Help needed to walk in hospital room?: A Little Help needed climbing 3-5 steps with a railing? : A Lot 6 Click Score: 19    End of Session   Activity Tolerance: Patient tolerated treatment well Patient left: with call bell/phone within reach;in chair;with chair alarm set Nurse Communication: Mobility status PT Visit Diagnosis: History of falling (Z91.81);Muscle weakness (generalized) (M62.81);Difficulty in walking, not elsewhere classified (R26.2)     Time: 5053-9767 PT Time Calculation (min) (ACUTE ONLY): 61 min  Charges:  $Gait Training: 23-37 mins $Therapeutic Activity: 23-37 mins                     Zaydee Aina B. Beverely Risen PT, DPT Acute Rehabilitation Services Pager [phone] Office [phone]    Elon Alas [facility] 08/10/2021, 3:37 PM

## 2021-08-11 DIAGNOSIS — J9601 Acute respiratory failure with hypoxia: Secondary | ICD-10-CM | POA: Diagnosis not present

## 2021-08-11 DIAGNOSIS — J9602 Acute respiratory failure with hypercapnia: Secondary | ICD-10-CM | POA: Diagnosis not present

## 2021-08-11 DIAGNOSIS — F419 Anxiety disorder, unspecified: Secondary | ICD-10-CM | POA: Diagnosis not present

## 2021-08-11 LAB — BASIC METABOLIC PANEL
Anion gap: 10 (ref 5–15)
BUN: 17 mg/dL (ref 6–20)
CO2: 28 mmol/L (ref 22–32)
Calcium: 8.9 mg/dL (ref 8.9–10.3)
Chloride: 98 mmol/L (ref 98–111)
Creatinine, Ser: 1.16 mg/dL (ref 0.61–1.24)
GFR, Estimated: >60 mL/min (ref >60)
Glucose, Bld: 117 mg/dL — ABNORMAL HIGH (ref 70–99)
Potassium: 3.8 mmol/L (ref 3.5–5.1)
Sodium: 136 mmol/L (ref 135–145)

## 2021-08-11 MED ORDER — FUROSEMIDE 40 MG PO TABS: 40.0000 mg | ORAL_TABLET | Freq: Two times a day (BID) | ORAL | 0 refills | Status: AC

## 2021-08-11 NOTE — Discharge Summary (Signed)
Physician Discharge Summary  Hunter King:[id number] DOB: 1966-11-10 DOA: 07/26/2021  PCP: Avis Epley, PA-C  Admit date: 07/26/2021 Discharge date: 08/11/2021  Admitted From: Home Disposition:  Home   Recommendations for Outpatient Follow-up:  Follow up with PCP in 1-2 weeks Please obtain BMP/CBC in one week Please follow up on the following pending results:  Home Health:YES Equipment/Devices: oxygen, trilogy,walker, commode  Discharge Condition:Stable CODE STATUS:FULL Diet recommendation: Heart Healthy  Brief/Interim Summary:  55 year old with past medical history significant for anxiety, depression, lymphedema, morbid obesity presents after having a fall at home.  Patient fell at home and passed out.  That time patient was evaluated at [facility] they check x-ray of his knee and he was discharged home. He presented to the ED [facility] again for further evaluation of syncopal episode. Patient was also noted to be hypoxic down to 87 on room air.  Chest x-ray showed cardiomegaly.  CT angio negative for PE, showed mild diffuse groundglass opacities suggest atelectasis or mild edema.  Acute on chronic hypoxic hypercapnic respiratory failure in the setting of obesity hypoventilation syndrome, acute on chronic diastolic heart failure exacerbation as well as COPD with severe obstructive disease -initially diuresed with IV Lasix, now converted to po.  He will be discharged on increased dose of Lasix 40 mg oral twice daily, transition to the states for last couple day with good output, his total -38 L during hospital stay -Given his hypercapnia, he did require BiPAP during hospital stay, trilogy has been arranged, and he tried it for last 2 nights he tolerated very well, discharged with neurology . -No further hypoxia, but oxygen has been arranged on discharge as needed.  . -Spirometry done during his hospital stay showed severe obstructive disease -Weight improved from  around 600 pounds on admission to currently at 507 on discharge,  He feels lighter and much better overall -Home health will be arranged, he was encouraged to use incentive spirometer and flutter valve at home as well.    Syncope Likely Vasovagal.  CTA negative for PE. ECHO no aortic valve stenosis.    Lymphedema -chronic:    Elevated D dimer -CT negative for PE -[place] negative for DVT.    Anxiety, Depression -Continue with Zoloft and Mirtazapine.    Obesity, with BMI of 65 at time of discharge -Needs life style modification.  As well patient following with bariatric surgery at [facility]   Shoulder, knee pain-Continue with tylenol schedule for few days,  Discharge Diagnoses:  Principal Problem:   Acute respiratory failure with hypoxia and hypercapnia (HCC) Active Problems:   Syncope and collapse   Bradycardia   Cardiomegaly   Morbid obesity with BMI of 70 and over, adult (HCC)   Lymphedema   Debility   Anxiety and depression   Syncope    Discharge Instructions  Discharge Instructions     Amb Referral to Nutrition and Diabetic Education   Complete by: As directed    Patient is eager to make lifestyle modifications and lose weight.   Diet - low sodium heart healthy   Complete by: As directed    Discharge instructions   Complete by: As directed    Follow with Primary MD Avis Epley, PA-C in 7 days   Get CBC, CMP,  checked  by Primary MD next visit.    Activity: As tolerated with Full fall precautions use walker/cane & assistance as needed   Disposition Home    Diet: Heart Healthy  .  For Heart failure patients - Check your Weight same time everyday, if you gain over 2 pounds, or you develop in leg swelling, experience more shortness of breath or chest pain, call your Primary MD immediately. Follow Cardiac Low Salt Diet and 1.5 lit/day fluid restriction.   On your next visit with your primary care physician please Get Medicines reviewed and  adjusted.   Please request your Prim.MD to go over all Hospital Tests and Procedure/Radiological results at the follow up, please get all Hospital records sent to your Prim MD by signing hospital release before you go home.   If you experience worsening of your admission symptoms, develop shortness of breath, life threatening emergency, suicidal or homicidal thoughts you must seek medical attention immediately by calling 911 or calling your MD immediately  if symptoms less severe.  You Must read complete instructions/literature along with all the possible adverse reactions/side effects for all the Medicines you take and that have been prescribed to you. Take any new Medicines after you have completely understood and accpet all the possible adverse reactions/side effects.   Do not drive, operating heavy machinery, perform activities at heights, swimming or participation in water activities or provide baby sitting services if your were admitted for syncope or siezures until you have seen by Primary MD or a Neurologist and advised to do so again.  Do not drive when taking Pain medications.    Do not take more than prescribed Pain, Sleep and Anxiety Medications  Special Instructions: If you have smoked or chewed Tobacco  in the last 2 yrs please stop smoking, stop any regular Alcohol  and or any Recreational drug use.  Wear Seat belts while driving.   Please note  You were cared for by a hospitalist during your hospital stay. If you have any questions about your discharge medications or the care you received while you were in the hospital after you are discharged, you can call the unit and asked to speak with the hospitalist on call if the hospitalist that took care of you is not available. Once you are discharged, your primary care physician will handle any further medical issues. Please note that NO REFILLS for any discharge medications will be authorized once you are discharged, as it is  imperative that you return to your primary care physician (or establish a relationship with a primary care physician if you do not have one) for your aftercare needs so that they can reassess your need for medications and monitor your lab values.   Increase activity slowly   Complete by: As directed    No wound care   Complete by: As directed       Allergies as of 08/11/2021       Reactions   Ibuprofen Swelling   Naproxen Swelling   Neosporin Plus Max St Rash   Nsaids Rash        Medication List     TAKE these medications    acetaminophen 325 MG tablet Commonly known as: TYLENOL Take 650 mg by mouth every 6 (six) hours as needed for mild pain or headache.   clobetasol ointment 0.05 % Commonly known as: TEMOVATE APPLY EXTERNALLY TO THE AFFECTED AREA TWICE DAILY   furosemide 40 MG tablet Commonly known as: LASIX Take 1 tablet (40 mg total) by mouth 2 (two) times daily. What changed: when to take this   loratadine 10 MG tablet Commonly known as: CLARITIN Take 1 tablet (10 mg total) by mouth daily. What changed:  when to take this reasons to take this   mirtazapine 15 MG tablet Commonly known as: REMERON Take 15 mg by mouth at bedtime as needed (sleep).   mupirocin ointment 2 % Commonly known as: BACTROBAN Apply 1 application topically 2 (two) times daily.   potassium chloride 10 MEQ tablet Commonly known as: KLOR-CON Take 10 mEq by mouth daily.   sertraline 100 MG tablet Commonly known as: ZOLOFT Take 150 mg by mouth daily.   urea 40 % Crea Commonly known as: CARMOL Apply 1 application topically daily. To callus areas on bottom of feet               Durable Medical Equipment  (From admission, onward)           Start     Ordered   08/10/21 1344  For home use only DME 4 wheeled rolling walker with seat  Once       Comments: Bariatric Drop arm  Question:  Patient needs a walker to treat with the following condition  Answer:  Obesity    08/10/21 1343   08/09/21 1453  For home use only DME oxygen  Once       Question Answer Comment  Length of Need Lifetime   Mode or (Route) Nasal cannula   Liters per Minute 2   Frequency Continuous (stationary and portable oxygen unit needed)   Oxygen conserving device Yes   Oxygen delivery system Gas      08/09/21 1452   08/09/21 1450  For home use only DME Bedside commode  Once       Comments: Bariatric  Question:  Patient needs a bedside commode to treat with the following condition  Answer:  Weakness   08/09/21 1451   08/09/21 1449  For home use only DME Walker rolling  Once       Question Answer Comment  Walker: Other   Comments bariatric   Patient needs a walker to treat with the following condition Weakness      08/09/21 1451            Follow-up Information     Avis Epley, PA-C Follow up.   Specialty: Family Medicine Contact information: [address] [place] [address] [phone]                Allergies  Allergen Reactions   Ibuprofen Swelling   Naproxen Swelling   Neosporin Plus Max St Rash   Nsaids Rash    Consultations: PCCM   Procedures/Studies: DG Chest 1 View  Result Date: 07/26/2021 CLINICAL DATA:  Dyspnea.  Leg swelling. EXAM: CHEST  1 VIEW COMPARISON:  None. FINDINGS: Cardiac enlargement. No pleural effusion or edema. No airspace opacities identified. The visualized osseous structures are unremarkable. IMPRESSION: 1. Cardiac enlargement. 2. No acute findings. Electronically Signed   By: Signa Kell M.D.   On: 07/26/2021 14:52   CT Angio Chest PE W and/or Wo Contrast  Result Date: 07/26/2021 CLINICAL DATA:  Concern for pulmonary embolism.  Short of breath. EXAM: CT ANGIOGRAPHY CHEST WITH CONTRAST TECHNIQUE: Multidetector CT imaging of the chest was performed using the standard protocol during bolus administration of intravenous contrast. Multiplanar CT image reconstructions and MIPs were obtained to evaluate the  vascular anatomy. CONTRAST:  OMNIPAQUE IOHEXOL 350 MG/ML SOLN COMPARISON:  None. FINDINGS: Cardiovascular: No filling defects within the pulmonary arteries to suggest acute pulmonary embolism. Mild moderate image degradation related to body habitus and respiratory motion. Mediastinum/Nodes: No axillary  or supraclavicular adenopathy. 15 mm lymph node adjacent to the bronchus intermedius (image 48/5) no pericardial fluid. Esophagus normal. Lungs/Pleura: No pulmonary infarction. No pneumonia. Ground-glass opacities in the upper lobes in a mosaic pattern. No pleural fluid. No pneumothorax. Upper Abdomen: Limited view of the liver, kidneys, pancreas are unremarkable. Normal adrenal glands. Musculoskeletal: No aggressive osseous lesion. Degenerative osteophytosis of the spine. Review of the MIP images confirms the above findings. IMPRESSION: 1. No evidence acute pulmonary embolism. Mild-to-moderate image degradation as above. 2. Mild diffuse ground-glass opacities suggest atelectasis or mild edema. 3. No pleural fluid or pneumonia. 4. Single subcarinal node is favored reactive. Electronically Signed   By: Genevive Bi M.D.   On: 07/26/2021 18:14   DG Knee Complete 4 Views Left  Result Date: 07/25/2021 CLINICAL DATA:  Fall EXAM: LEFT KNEE - COMPLETE 4+ VIEW COMPARISON:  Knee radiographs 05/30/2006 FINDINGS: Study quality is degraded due to suboptimal patient positioning particularly on the lateral projection. There is no acute fracture or dislocation. Alignment is normal on the AP images. There is moderate medial and mild lateral tibiofemoral joint space narrowing with associated subchondral sclerosis and osteophytosis. A suprasellar effusion cannot be excluded due to positioning. IMPRESSION: No fracture or dislocation, within the confines of suboptimal patient positioning. Electronically Signed   By: Lesia Hausen MD   On: 07/25/2021 10:46   ECHOCARDIOGRAM COMPLETE  Result Date: 07/27/2021     ECHOCARDIOGRAM REPORT   Patient Name:   Hunter King [facility] Date of Exam: 07/27/2021 Medical Rec #:  [id number]        Height:       74.0 in Accession #:    [id number]       Weight:       650.0 lb Date of Birth:  12-Apr-1966        BSA:          3.587 m Patient Age:    55 years         BP:           137/63 mmHg Patient Gender: M                HR:           75 bpm. Exam Location:  Inpatient Procedure: 2D Echo, Cardiac Doppler, Color Doppler and Intracardiac            Opacification Agent Indications:    I51.7 Cardiomegaly  History:        Patient has no prior history of Echocardiogram examinations.                 Risk Factors:Hypertension.  Sonographer:    Elmarie Shiley Dance Referring Phys: [id number] RONDELL A SMITH  Sonographer Comments: Patient is morbidly obese. IMPRESSIONS  1. Left ventricular ejection fraction, by estimation, is 65 to 70%. The left ventricle has normal function. The left ventricle has no regional wall motion abnormalities. There is moderate left ventricular hypertrophy. Left ventricular diastolic parameters are consistent with Grade II diastolic dysfunction (pseudonormalization). Elevated left ventricular end-diastolic pressure. The E/e' is 15.  2. Right ventricular systolic function is normal. The right ventricular size is mildly enlarged. There is mildly elevated pulmonary artery systolic pressure. The estimated right ventricular systolic pressure is 44.7 mmHg.  3. The mitral valve is grossly normal. No evidence of mitral valve regurgitation.  4. The aortic valve is tricuspid. Aortic valve regurgitation is not visualized.  5. The inferior vena cava is dilated in size with >50% respiratory variability, suggesting right  atrial pressure of 8 mmHg. Comparison(s): No prior Echocardiogram. FINDINGS  Left Ventricle: Left ventricular ejection fraction, by estimation, is 65 to 70%. The left ventricle has normal function. The left ventricle has no regional wall motion abnormalities. Definity contrast agent was  given IV to delineate the left ventricular  endocardial borders. The left ventricular internal cavity size was normal in size. There is moderate left ventricular hypertrophy. Left ventricular diastolic parameters are consistent with Grade II diastolic dysfunction (pseudonormalization). Elevated left ventricular end-diastolic pressure. The E/e' is 15. Right Ventricle: The right ventricular size is mildly enlarged. No increase in right ventricular wall thickness. Right ventricular systolic function is normal. There is mildly elevated pulmonary artery systolic pressure. The tricuspid regurgitant velocity is 3.03 m/s, and with an assumed right atrial pressure of 8 mmHg, the estimated right ventricular systolic pressure is 44.7 mmHg. Left Atrium: Left atrial size was normal in size. Right Atrium: Right atrial size was normal in size. Pericardium: There is no evidence of pericardial effusion. Mitral Valve: The mitral valve is grossly normal. No evidence of mitral valve regurgitation. Tricuspid Valve: The tricuspid valve is grossly normal. Tricuspid valve regurgitation is trivial. Aortic Valve: The aortic valve is tricuspid. Aortic valve regurgitation is not visualized. Pulmonic Valve: The pulmonic valve was normal in structure. Pulmonic valve regurgitation is not visualized. Aorta: The aortic root and ascending aorta are structurally normal, with no evidence of dilitation. Venous: The inferior vena cava is dilated in size with greater than 50% respiratory variability, suggesting right atrial pressure of 8 mmHg. IAS/Shunts: No atrial level shunt detected by color flow Doppler.  LEFT VENTRICLE PLAX 2D LVIDd:         5.60 cm  Diastology LVIDs:         3.50 cm  LV e' medial:    8.59 cm/s LV PW:         1.30 cm  LV E/e' medial:  15.5 LV IVS:        1.50 cm  LV e' lateral:   9.25 cm/s LVOT diam:     2.10 cm  LV E/e' lateral: 14.4 LV SV:         97 LV SV Index:   27 LVOT Area:     3.46 cm  RIGHT VENTRICLE             IVC RV  Basal diam:  4.30 cm     IVC diam: 3.60 cm RV Mid diam:    3.40 cm RV S prime:     14.10 cm/s TAPSE (M-mode): 2.5 cm LEFT ATRIUM              Index       RIGHT ATRIUM           Index LA diam:        4.30 cm  1.20 cm/m  RA Area:     22.70 cm LA Vol (A2C):   109.0 ml 30.39 ml/m RA Volume:   77.00 ml  21.47 ml/m LA Vol (A4C):   57.9 ml  16.14 ml/m LA Biplane Vol: 79.4 ml  22.14 ml/m  AORTIC VALVE             PULMONIC VALVE LVOT Vmax:   133.00 cm/s PR End Diast Vel: 5.57 msec LVOT Vmean:  98.300 cm/s LVOT VTI:    0.279 m  AORTA Ao Root diam: 3.30 cm Ao Asc diam:  3.70 cm MITRAL VALVE  TRICUSPID VALVE MV Area (PHT): 2.76 cm     TR Peak grad:   36.7 mmHg MV Decel Time: 275 msec     TR Vmax:        303.00 cm/s MV E velocity: 133.00 cm/s MV A velocity: 88.70 cm/s   SHUNTS MV E/A ratio:  1.50         Systemic VTI:  0.28 m                             Systemic Diam: 2.10 cm Zoila Shutter MD Electronically signed by Zoila Shutter MD Signature Date/Time: 07/27/2021/1:29:01 PM    Final    VAS [place] LOWER EXTREMITY VENOUS (DVT)  Result Date: 07/27/2021  Lower Venous DVT Study Patient Name:  ERIN UECKER [facility]  Date of Exam:   07/27/2021 Medical Rec #: [id number]         Accession #:    [id number] Date of Birth: [birth year]         Patient Gender: M Patient Age:   75 years Exam Location:  [facility] Procedure:      VAS [place] LOWER EXTREMITY VENOUS (DVT) Referring Phys: Madelyn Flavors --------------------------------------------------------------------------------  Indications: SOB, elevated d-dimer.  Limitations: Body habitus, morbid obesity, and poor ultrasound/tissue interface due to skin changes. Comparison Study: 07-26-2021 CTA chest was negative for PE. Performing Technologist: Jean Rosenthal RDMS,RVT  Examination Guidelines: A complete evaluation includes B-mode imaging, spectral Doppler, color Doppler, and power Doppler as needed of all accessible portions of each vessel. Bilateral testing is considered an  integral part of a complete examination. Limited examinations for reoccurring indications may be performed as noted. The reflux portion of the exam is performed with the patient in reverse Trendelenburg.  +---------+---------------+---------+-----------+----------+--------------+ RIGHT    CompressibilityPhasicitySpontaneityPropertiesThrombus Aging +---------+---------------+---------+-----------+----------+--------------+ CFV      Full           Yes      Yes                                 +---------+---------------+---------+-----------+----------+--------------+ SFJ      Full                                                        +---------+---------------+---------+-----------+----------+--------------+ FV Prox  Full                                                        +---------+---------------+---------+-----------+----------+--------------+ FV Mid                  Yes      Yes                                 +---------+---------------+---------+-----------+----------+--------------+ FV Distal               Yes      Yes                                 +---------+---------------+---------+-----------+----------+--------------+  PFV      Full                                                        +---------+---------------+---------+-----------+----------+--------------+ POP                     Yes      Yes                                 +---------+---------------+---------+-----------+----------+--------------+ PTV                     Yes      Yes                                 +---------+---------------+---------+-----------+----------+--------------+ PERO                                                  Not visualized +---------+---------------+---------+-----------+----------+--------------+   +---------+---------------+---------+-----------+----------+--------------+ LEFT     CompressibilityPhasicitySpontaneityPropertiesThrombus  Aging +---------+---------------+---------+-----------+----------+--------------+ CFV      Full           Yes      Yes                                 +---------+---------------+---------+-----------+----------+--------------+ SFJ      Full                                                        +---------+---------------+---------+-----------+----------+--------------+ FV Prox  Full                                                        +---------+---------------+---------+-----------+----------+--------------+ FV Mid                  Yes      Yes                                 +---------+---------------+---------+-----------+----------+--------------+ FV Distal               Yes      Yes                                 +---------+---------------+---------+-----------+----------+--------------+ PFV      Full                                                        +---------+---------------+---------+-----------+----------+--------------+  POP      Full           Yes      Yes                                 +---------+---------------+---------+-----------+----------+--------------+ PTV                     Yes      Yes                                 +---------+---------------+---------+-----------+----------+--------------+ PERO                    Yes      Yes                                 +---------+---------------+---------+-----------+----------+--------------+     Summary: RIGHT: - There is no evidence of deep vein thrombosis in the lower extremity. However, portions of this examination were limited- see technologist comments above.  - No cystic structure found in the popliteal fossa.  LEFT: - There is no evidence of deep vein thrombosis in the lower extremity. However, portions of this examination were limited- see technologist comments above.  - No cystic structure found in the popliteal fossa.  *See table(s) above for measurements and  observations. Electronically signed by Sherald Hess MD on 07/27/2021 at 2:08:29 PM.    Final      Subjective: No chest pain, no shortness of breath at rest currently, reports he slept well tolerated trilogy overnight.  Discharge Exam: Vitals:   08/11/21 0511 08/11/21 1223  BP: (!) 118/58 133/66  Pulse: 62 65  Resp: 16 18  Temp: 97.7 F (36.5 C) 97.9 F (36.6 C)  SpO2: 93% 99%   Vitals:   08/10/21 2052 08/10/21 2054 08/11/21 0511 08/11/21 1223  BP:  128/73 (!) 118/58 133/66  Pulse: 76 71 62 65  Resp:  Temp:  98.2 F (36.8 C) 97.7 F (36.5 C) 97.9 F (36.6 C)  TempSrc:  Oral Axillary Oral  SpO2: 92% 99% 93% 99%  Weight:   (!) 230.4 kg     General: Pt is alert, awake, not in acute distress Cardiovascular: RRR, S1/S2 +, no rubs, no gallops Respiratory: No wheezing, no rhonchi anteriorly, no respiratory Abdominal: Soft, NT, bowel sounds + Extremities: Lower extremity with significant lymphedema bilaterally and chronic skin changes    The results of significant diagnostics from this hospitalization (including imaging, microbiology, ancillary and laboratory) are listed below for reference.     Microbiology: No results found for this or any previous visit (from the past 240 hour(s)).   Labs: BNP (last 3 results) Recent Labs    07/26/21 1414  BNP 229.8*   Basic Metabolic Panel: Recent Labs  Lab 08/09/21 0317 08/11/21 0357  NA 137 136  K 3.6 3.8  CL 98 98  CO2 31 28  GLUCOSE 126* 117*  BUN 17 17  CREATININE 1.10 1.16  CALCIUM 8.8* 8.9   Liver Function Tests: No results for input(s): AST, ALT, ALKPHOS, BILITOT, PROT, ALBUMIN in the last 168 hours. No results for input(s): LIPASE, AMYLASE in the last 168 hours. No results for input(s): AMMONIA in the last 168 hours. CBC: Recent Labs  Lab 08/09/21  0317  WBC 5.7  HGB 12.7*  HCT 40.7  MCV 86.6  PLT 252   Cardiac Enzymes: No results for input(s): CKTOTAL, CKMB, CKMBINDEX, TROPONINI in the  last 168 hours. BNP: Invalid input(s): POCBNP CBG: No results for input(s): GLUCAP in the last 168 hours. D-Dimer No results for input(s): DDIMER in the last 72 hours. Hgb A1c No results for input(s): HGBA1C in the last 72 hours. Lipid Profile No results for input(s): CHOL, HDL, LDLCALC, TRIG, CHOLHDL, LDLDIRECT in the last 72 hours. Thyroid function studies No results for input(s): TSH, T4TOTAL, T3FREE, THYROIDAB in the last 72 hours.  Invalid input(s): FREET3 Anemia work up No results for input(s): VITAMINB12, FOLATE, FERRITIN, TIBC, IRON, RETICCTPCT in the last 72 hours. Urinalysis    Component Value Date/Time   COLORURINE AMBER (A) 07/26/2021 1350   APPEARANCEUR CLEAR 07/26/2021 1350   LABSPEC 1.025 07/26/2021 1350   PHURINE 6.0 07/26/2021 1350   GLUCOSEU NEGATIVE 07/26/2021 1350   HGBUR NEGATIVE 07/26/2021 1350   BILIRUBINUR NEGATIVE 07/26/2021 1350   KETONESUR NEGATIVE 07/26/2021 1350   PROTEINUR NEGATIVE 07/26/2021 1350   UROBILINOGEN 2.0 (H) 10/19/2018 1609   NITRITE NEGATIVE 07/26/2021 1350   LEUKOCYTESUR NEGATIVE 07/26/2021 1350   Sepsis Labs Invalid input(s): PROCALCITONIN,  WBC,  LACTICIDVEN Microbiology No results found for this or any previous visit (from the past 240 hour(s)).   Time coordinating discharge: Over 30 minutes  SIGNED:   Huey Bienenstock, MD  [facility] 08/11/2021, 12:43 PM Pager   If 7PM-7AM, please contact night-coverage [email or web address] Password [email or web address]

## 2021-08-11 NOTE — Progress Notes (Signed)
Occupational Therapy Treatment Patient Details Name: Hunter King MRN: [id number] DOB: Apr 23, 1966 Today's Date: 08/11/2021    History of present illness Pt is a 55yo male who presents to [facility] ED on 8/10 secondary to recent syncope on 8/9. Of note, was seen at [facility] ED 8/9 and evaluated for left knee pain but pt and family did not feel that syncopal episode was appropriately evaluated. Noted to be hypoxic. CTA of Chest showed mild diffuse ground-glass opacities suggestive of atelectasis or mild  edema. PMH: chronic lymphodema, DM, Morbid obesity, R knee surgery, bradycardia, anxiety, depression.   OT comments  Final STAR OT session: Pt with planned discharge today. Session focused on ability to ambulate to/from bathroom without AD due to not all doorways DME-friendly at home. Pt able to mobilize in room independently, complete grooming tasks standing at sink and dressing tasks in prep for DC using AE without physical assist. Reinforced education re: energy conservation, CHF mgmt strategies (booklet provided), and gradual progression of activity tolerance at home. Pt motivated to continue progression at home. SpO2 96% on RA. Would continue to rec HHOT to progression ADLs/IADLs in home environment.   Modified Barthel Index: 83/100 (noted limitations in toileting hygiene due to environmental/AE differences in this environment)   Follow Up Recommendations  Home health OT;Supervision - Intermittent    Equipment Recommendations  Other (comment) (Bariatric rollator, bariatric BSC (drop arm would be helpful))    Recommendations for Other Services      Precautions / Restrictions Precautions Precautions: Fall Precaution Comments: OCD; agoraphobic; skin breakdown Restrictions Weight Bearing Restrictions: No       Mobility Bed Mobility Overal bed mobility: Modified Independent Bed Mobility: Supine to Sit     Supine to sit: Modified independent (Device/Increase time)           Transfers Overall transfer level: Independent Equipment used: None Transfers: Sit to/from Stand Sit to Stand: Independent         General transfer comment: no use of AD for various sit to stands from bed during session    Balance Overall balance assessment: Needs assistance Sitting-balance support: Bilateral upper extremity supported;Feet supported Sitting balance-Leahy Scale: Normal     Standing balance support: During functional activity;No upper extremity supported Standing balance-Leahy Scale: Good Standing balance comment: able to mobilize short distances without support                           ADL either performed or assessed with clinical judgement   ADL Overall ADL's : Needs assistance/impaired     Grooming: Independent;Standing;Oral care Grooming Details (indicate cue type and reason): No use of AD, able to stand for duration of task without leaning on sink         Upper Body Dressing : Independent;Sitting   Lower Body Dressing: Modified independent;Sit to/from stand;With adaptive equipment Lower Body Dressing Details (indicate cue type and reason): with use of reacher, good problem solving             Functional mobility during ADLs: Independent General ADL Comments: Session focused on ability to ambulate simualted distance to/from bathroom at home without AD as not all doorways DME-friendly at home. Reinforced energy conservation strategies, gradual build up of activity tolerance and strategies to pervent CHF exacerbation (CHR booklet procided)     Vision   Vision Assessment?: No apparent visual deficits   Perception     Praxis      Cognition Arousal/Alertness: Awake/alert Behavior  During Therapy: WFL for tasks assessed/performed Overall Cognitive Status: Within Functional Limits for tasks assessed                                 General Comments: motivated to progress mobility, limited by anxiety at times         Exercises     Shoulder Instructions       General Comments 96% O2 on RA    Pertinent Vitals/ Pain       Pain Assessment: Faces Faces Pain Scale: Hurts little more Pain Location: knees (R knee arthritic) low back Pain Descriptors / Indicators: Discomfort;Aching;Sore Pain Intervention(s): Monitored during session;Limited activity within patient's tolerance  Home Living                                          Prior Functioning/Environment              Frequency  Min 2X/week        Progress Toward Goals  OT Goals(current goals can now be found in the care plan section)  Progress towards OT goals: Progressing toward goals  Acute Rehab OT Goals Patient Stated Goal: walk and climb stairs OT Goal Formulation: With patient Time For Goal Achievement: 08/13/21 Potential to Achieve Goals: Good ADL Goals Pt Will Perform Lower Body Bathing: with adaptive equipment;with supervision;with set-up Pt Will Perform Lower Body Dressing: with supervision;with set-up;with adaptive equipment;sit to/from stand Pt Will Transfer to Toilet: with modified independence;ambulating Pt Will Perform Toileting - Clothing Manipulation and hygiene: with modified independence;sit to/from stand;sitting/lateral leans;with adaptive equipment Pt/caregiver will Perform Home Exercise Program: Increased strength;Independently;With theraband;With written HEP provided Additional ADL Goal #1: Pt will verbalize 2 strateiges to manage MASD in skin folds and assist with care  Plan Discharge plan remains appropriate    Co-evaluation                 AM-PAC OT "6 Clicks" Daily Activity     Outcome Measure   Help from another person eating meals?: None Help from another person taking care of personal grooming?: None Help from another person toileting, which includes using toliet, bedpan, or urinal?: A Little Help from another person bathing (including washing, rinsing, drying)?: A  Lot Help from another person to put on and taking off regular upper body clothing?: None Help from another person to put on and taking off regular lower body clothing?: None 6 Click Score: 21    End of Session    OT Visit Diagnosis: Other abnormalities of gait and mobility (R26.89);Muscle weakness (generalized) (M62.81);History of falling (Z91.81);Pain Pain - Right/Left: Left Pain - part of body: Knee   Activity Tolerance Patient tolerated treatment well   Patient Left in bed;with call bell/phone within reach   Nurse Communication Mobility status        Time: 1779-3903 OT Time Calculation (min): 50 min  Charges: OT General Charges $OT Visit: 1 Visit OT Treatments $Self Care/Home Management : 23-37 mins $Therapeutic Activity: 8-22 mins  Bradd Canary, OTR/L Acute Rehab Services Office: [phone]    Hunter King 08/11/2021, 11:30 AM

## 2021-08-11 NOTE — Progress Notes (Signed)
Physical Therapy Treatment Patient Details Name: Hunter King MRN: [id number] DOB: 22-Apr-1966 Today's Date: 08/11/2021    History of Present Illness Pt is a 55yo male who presents to [facility] ED on 8/10 secondary to recent syncope on 8/9. Of note, was seen at [facility] ED 8/9 and evaluated for left knee pain but pt and family did not feel that syncopal episode was appropriately evaluated. Noted to be hypoxic. CTA of Chest showed mild diffuse ground-glass opacities suggestive of atelectasis or mild  edema. PMH: chronic lymphodema, DM, Morbid obesity, R knee surgery, bradycardia, anxiety, depression.    PT Comments    Pt getting ready for transport home. PTAR in room stating limitations of their services. PT provides information on pt inability to climb stairs at this time. PTAR did not bring bariatric stretcher and unsafe to transfer in standard stretcher. PTAR left to retrieve bariatric stretcher. In interim problem solved mobility at home and agreed pt unable to climb steps safely. Assured by upper management that PTAR would provide 4 people and local fire department would bring 6 people to safely lift pt up stair to his home. Then pt participated in Stevinson Balance test and improved score from 32/56 to 44/56 which still places pt at significant risk of falls. PTAR returned with bariatric stretcher and pt able to independent get on for transfer home.     Follow Up Recommendations  Home health PT;Supervision for mobility/OOB     Equipment Recommendations  3in1 (PT);Rolling walker with 5" wheels (bariatric rollator and bariatric bed side commode.)       Precautions / Restrictions Precautions Precautions: Fall Precaution Comments: OCD; agoraphobic; skin breakdown Restrictions Weight Bearing Restrictions: No    Mobility  Bed Mobility Overal bed mobility: Needs Assistance Bed Mobility: Supine to Sit     Supine to sit: Modified independent (Device/Increase time) Sit to supine: Modified  independent (Device/Increase time)   General bed mobility comments: use of bed deflated and bed rails to get up and back down in a flattened bed    Transfers Overall transfer level: Needs assistance Equipment used: 4-wheeled walker;None Transfers: Sit to/from [employer] Sit to Stand: Modified independent (Device/Increase time)         General transfer comment: pt able to sit>stand from bed at least 5 times without use of hands or AD  Ambulation/Gait Ambulation/Gait assistance: Supervision Gait Distance (Feet): 10 Feet Assistive device: None Gait Pattern/deviations: Step-through pattern;Decreased stride length;Trunk flexed;Drifts right/left Gait velocity: decr   General Gait Details: cues for breathing,          Balance Overall balance assessment: Needs assistance Sitting-balance support: Bilateral upper extremity supported;Feet supported Sitting balance-Leahy Scale: Normal     Standing balance support: During functional activity;No upper extremity supported Standing balance-Leahy Scale: Good Standing balance comment: able to stand without support                 Standardized Balance Assessment Standardized Balance Assessment : Berg Balance Test Berg Balance Test Sit to Stand: Able to stand without using hands and stabilize independently Standing Unsupported: Able to stand safely 2 minutes Sitting with Back Unsupported but Feet Supported on Floor or Stool: Able to sit safely and securely 2 minutes Stand to Sit: Sits safely with minimal use of hands Transfers: Able to transfer safely, minor use of hands Standing Unsupported with Eyes Closed: Able to stand 10 seconds safely Standing Ubsupported with Feet Together: Able to place feet together independently and stand for 1 minute with supervision  From Standing, Reach Forward with Outstretched Arm: Can reach confidently >25 cm (10") From Standing Position, Pick up Object from Floor: Unable to pick  up shoe, but reaches 2-5 cm (1-2") from shoe and balances independently From Standing Position, Turn to Look Behind Over each Shoulder: Looks behind from both sides and weight shifts well Turn 360 Degrees: Able to turn 360 degrees safely in 4 seconds or less Standing Unsupported, Alternately Place Feet on Step/Stool: Able to complete >2 steps/needs minimal assist Standing Unsupported, One Foot in Front: Needs help to step but can hold 15 seconds Standing on One Leg: Tries to lift leg/unable to hold 3 seconds but remains standing independently Total Score: 44        Cognition Arousal/Alertness: Awake/alert Behavior During Therapy: WFL for tasks assessed/performed (less anxious about mobility tasks) Overall Cognitive Status: Within Functional Limits for tasks assessed                                 General Comments: motivated to progress mobility, limited by anxiety at times         General Comments General comments (skin integrity, edema, etc.): VSS on RA      Pertinent Vitals/Pain Pain Assessment: Faces Faces Pain Scale: Hurts little more Pain Location: knees (R knee arthritic) low back Pain Descriptors / Indicators: Discomfort;Aching;Sore Pain Intervention(s): Limited activity within patient's tolerance;Monitored during session;Repositioned     PT Goals (current goals can now be found in the care plan section) Acute Rehab PT Goals Patient Stated Goal: walk and climb stairs PT Goal Formulation: With patient Time For Goal Achievement: 08/17/21 Potential to Achieve Goals: Good    Frequency    Min 3X/week      PT Plan Current plan remains appropriate       AM-PAC PT "6 Clicks" Mobility   Outcome Measure  Help needed turning from your back to your side while in a flat bed without using bedrails?: None Help needed moving from lying on your back to sitting on the side of a flat bed without using bedrails?: None Help needed moving to and from a bed to a  chair (including a wheelchair)?: None Help needed standing up from a chair using your arms (e.g., wheelchair or bedside chair)?: None Help needed to walk in hospital room?: None Help needed climbing 3-5 steps with a railing? : A Lot 6 Click Score: 22    End of Session   Activity Tolerance: Patient tolerated treatment well Patient left: with call bell/phone within reach;in chair;with chair alarm set Nurse Communication: Mobility status PT Visit Diagnosis: History of falling (Z91.81);Muscle weakness (generalized) (M62.81);Difficulty in walking, not elsewhere classified (R26.2)     Time: 3875-6433 PT Time Calculation (min) (ACUTE ONLY): 48 min  Charges:  $Therapeutic Activity: 23-37 mins $Physical Performance Test: 8-22 mins                     Holland Nickson B. Beverely Risen PT, DPT Acute Rehabilitation Services Pager [phone] Office [phone]    Elon Alas Fleet 08/11/2021, 4:03 PM

## 2021-08-11 NOTE — TOC Transition Note (Addendum)
Transition of Care [facility]) - CM/SW Discharge Note   Patient Details  Name: Hunter King MRN: [id number] Date of Birth: 23-Apr-1966  Transition of Care [facility]) CM/SW Contact:  Lorri Frederick, LCSW Phone Number: 08/11/2021, 11:50 AM   Clinical Narrative:   Pt [facility] with [facility].  [facility] provided trilogy, bedside commode (bariatric), rollator, and home O2.  [facility] to transport.    Around 1330: CSW notified by RN that there is transportation issue with PTAR.  CSW spoke with PTAR who reports that they will not be able to get pt into his house on the stretcher and will need to use a transportation bag to get pt up the stairs.  Pt is stating he is not OK with using the bag.  CSW spoke with the pt who confirms that this bag was used to get him out of his home prior to this admission and it was uncomfortable, led to bruises, and he does not want this again. Pt stating a ramp is going to be built tomorrow, would like to wait another day as he can then get into the house on the stretcher.    CSW spoke Asst Director Celine Ahr and we both spoke with Unit director Carollee Herter with decision being that holding pt in anticipation of ramp is not an option.  Asst director informed pt of this and pt understood.  PTAR informed and they were going to get a larger stretcher, will return and transport pt.  CSW also received call from [facility] supervisor Jiles Crocker and reviewed this situation with her.     Final next level of care: Home w Home Health Services Barriers to Discharge: Barriers Resolved   Patient Goals and CMS Choice Patient states their goals for this hospitalization and ongoing recovery are:: healthy weight, bac, to being more active      Discharge Placement                Patient to be transferred to facility by: PTAR to home      Discharge Plan and Services     Post Acute Care Choice: IP Rehab          DME Arranged: Bedside commode, Walker rolling with seat, Oxygen, Other  see comment (trilogy) DME Agency: [facility] Date DME Agency Contacted: 08/07/21 Time DME Agency Contacted: 1500 Representative spoke with at DME Agency: Zack HH Arranged: PT, OT, RN HH Agency: [facility] Date HH Agency Contacted: 08/10/21 Time HH Agency Contacted: 1500 Representative spoke with at [facility] Agency: Andrez Grime  Social Determinants of Health (SDOH) Interventions     Readmission Risk Interventions No flowsheet data found.

## 2021-08-11 NOTE — Progress Notes (Signed)
Discharge packet reviewed with and patient and placed in patient packet given to [facility]

## 2022-03-13 ENCOUNTER — Other Ambulatory Visit: Payer: Self-pay

## 2022-03-13 ENCOUNTER — Encounter: Payer: Self-pay | Admitting: Pulmonary Disease

## 2022-03-13 ENCOUNTER — Ambulatory Visit (INDEPENDENT_AMBULATORY_CARE_PROVIDER_SITE_OTHER): Payer: No Typology Code available for payment source | Admitting: Pulmonary Disease

## 2022-03-13 VITALS — BP 130/70 | HR 63 | Ht 74.0 in | Wt >= 6400 oz

## 2022-03-13 DIAGNOSIS — R0683 Snoring: Secondary | ICD-10-CM

## 2022-03-13 DIAGNOSIS — Z6841 Body Mass Index (BMI) 40.0 and over, adult: Secondary | ICD-10-CM

## 2022-03-13 DIAGNOSIS — J9601 Acute respiratory failure with hypoxia: Secondary | ICD-10-CM | POA: Diagnosis not present

## 2022-03-13 DIAGNOSIS — J9602 Acute respiratory failure with hypercapnia: Secondary | ICD-10-CM | POA: Diagnosis not present

## 2022-03-13 DIAGNOSIS — E662 Morbid (severe) obesity with alveolar hypoventilation: Secondary | ICD-10-CM | POA: Diagnosis not present

## 2022-03-13 DIAGNOSIS — J9611 Chronic respiratory failure with hypoxia: Secondary | ICD-10-CM

## 2022-03-13 DIAGNOSIS — J9612 Chronic respiratory failure with hypercapnia: Secondary | ICD-10-CM

## 2022-03-13 NOTE — Assessment & Plan Note (Addendum)
He likely has severe OSA with obesity hypoventilation due to super morbid obesity. ?We will proceed with a split-night study. ?This will allow [place] to obtain baseline as well as see if we can titrate BiPAP and see how much level of oxygen is required ?We will subsequently plan to switch him from Trelegy to BiPAP with oxygen blended in ? ?We may need bariatric bed for his study ? ? The pathophysiology of obstructive sleep apnea , it's cardiovascular consequences & modes of treatment including CPAP were discused with the patient in detail & they evidenced understanding. ? ?

## 2022-03-13 NOTE — Assessment & Plan Note (Signed)
He desaturates on ambulation requires 4 L on exertion to maintain saturation. ?This should qualify for recertification for oxygen ? ?He did have hypercarbia and would qualify for trilogy on that basis ?Spirometry shows mainly restriction due to obesity rather than airway obstruction ?The patient continues to exhibit signs of hypercapnea associated with chronic respiratory failure   Interruption or failure to provide NIV would quickly lead to exacerbation of the patient's condition, hospital readmission, and likely harm the patient.  Continued use is preferred.  The use of the NIV will treat the patient's PCO2 levels and can reduce the risk of exacerbations and future hospitalizations when used at night and during the day.  Bilevel/RAD therapy with and without a rate would be ineffective as the patient requires a volume targeted mode.  Ventilation is required to decrease the work of breathing and improve pulmonary status.  Interruption of ventilator support would lead to a decline of health status.  Patient is able to protect their airways and clear secretions on their own. ? ? ?

## 2022-03-13 NOTE — Assessment & Plan Note (Signed)
He should seriously consider bariatric surgery for weight loss ?

## 2022-03-13 NOTE — Patient Instructions (Signed)
? ?  X spirometry today ? ?X Split night study ? ?X ambulatory sat ? ? ?

## 2022-03-13 NOTE — Progress Notes (Signed)
? ?Subjective:  ? ? Patient ID: Hunter King, male    DOB: 13-Sep-1966, 56 y.o.   MRN: [id number] ? ?HPI ?Link Snuffer is a super morbidly obese 56 year old who presents for evaluation of chronic hypoxic respiratory failure and OSA/OHS ? ?Hosp admit 07/2021 -fall & passed out , hypoxic 87 % RA ?ABG 7.3 9/55/92 ?Spirometry >> severe restriction ?Placed on BiPAP, dc'd on oxygen & Trilogy machine , wt decreased from 600 to 507 with diuresis ? ?He now presents for recertification of trilogy device, PCP felt that he needed repeat spirometry.  He reports that Trelegy has really helped him, he is very compliant and uses it every night, settled down with a full facemask.  He has self tapered off oxygen in the daytime and only uses it as needed.  He has oxygen blended into trilogy at night ? ?Epworth sleepiness score is 7. ?Bedtime is after midnight and can be up to 2 AM, sleep latency is variable, he may sleeps on his back with 2 pillows, uses his lymphedema pump for an hour prior to bedtime and uses compression stockings all day. ?He denies nocturnal awakenings Trelegy has really helped give him better sleep and is out of bed at 9 AM feeling rested with dryness of mouth but denies headaches ?There is no history suggestive of cataplexy, sleep paralysis or parasomnias ? ?DME : [facility] ? ? ?PMH -agoraphobia/severe anxiety, depression, lymphedema, morbid obesity  ?He smoked about 10 pack years and quit in his 30s ? ?Significant tests/ events reviewed ? ?02/2022 ambulation-desat to 83% , required 4 L of oxygen to maintain saturation ?Spirometry 02/2022 showed severe restriction with ratio 83, FEV1 42%, FVC 39% ?Spirometry 07/2021 >> severe restriction, ratio 72, FEV1 31%, FVC 33% ? ?CTA chest 07/2021 >>Ground-glass opacities in the upper lobes in a mosaic pattern ? ? ? ?Past Medical History:  ?Diagnosis Date  ? Arthritis   ? Diabetes mellitus without complication (HCC)   ? Obesities, morbid (HCC)   ? ? ?Past Surgical History:  ?Procedure  Laterality Date  ? FINGER SURGERY Right   ? HERNIA REPAIR    ? HYDROCELE EXCISION / REPAIR    ? KNEE SURGERY Right   ? ? ?Allergies  ?Allergen Reactions  ? Ibuprofen Swelling  ? Naproxen Swelling  ? Neosporin Plus Max St Rash  ? Nsaids Rash  ? ? ?Social History  ? ?Socioeconomic History  ? Marital status: Married  ?  Spouse name: Not on file  ? Number of children: Not on file  ? Years of education: Not on file  ? Highest education level: Not on file  ?Occupational History  ? Not on file  ?Tobacco Use  ? Smoking status: Former  ?  Types: Cigarettes  ? Smokeless tobacco: Never  ?Substance and Sexual Activity  ? Alcohol use: Yes  ?  Comment: socially on weekends  ? Drug use: Not Currently  ? Sexual activity: Not on file  ?Other Topics Concern  ? Not on file  ?Social History Narrative  ? Not on file  ? ?Social Determinants of Health  ? ?Financial Resource Strain: Not on file  ?Food Insecurity: Not on file  ?Transportation Needs: Not on file  ?Physical Activity: Not on file  ?Stress: Not on file  ?Social Connections: Not on file  ?Intimate Partner Violence: Not on file  ? ? ? ?Family History  ?Problem Relation Age of Onset  ? Fibromyalgia Mother   ? ? ? ?Review of Systems ? ?Shortness of breath  with activity ?Weight gain ?Anxiety and depression ? ? ?Constitutional: negative for anorexia, fevers and sweats  ?Eyes: negative for irritation, redness and visual disturbance  ?Ears, nose, mouth, throat, and face: negative for earaches, epistaxis, nasal congestion and sore throat  ?Respiratory: negative for cough,  sputum and wheezing  ?Cardiovascular: negative for chest pain, orthopnea, palpitations and syncope  ?Gastrointestinal: negative for abdominal pain, constipation, diarrhea, melena, nausea and vomiting  ?Genitourinary:negative for dysuria, frequency and hematuria  ?Hematologic/lymphatic: negative for bleeding, easy bruising and lymphadenopathy  ?Musculoskeletal:negative for arthralgias, muscle weakness and stiff joints   ?Neurological: negative for coordination problems, gait problems, headaches and weakness  ?Endocrine: negative for diabetic symptoms including polydipsia, polyuria and weight loss ? ?   ?Objective:  ? Physical Exam ? ?Gen. Pleasant, obese, in no distress, normal affect ?ENT - no pallor,icterus, no post nasal drip, class 2 airway ?Neck: No JVD, no thyromegaly, no carotid bruits ?Lungs: no use of accessory muscles, no dullness to percussion, decreased without rales or rhonchi  ?Cardiovascular: Rhythm regular, heart sounds  normal, no murmurs or gallops, no peripheral edema ?Abdomen: soft and non-tender, no hepatosplenomegaly, BS normal. ?Musculoskeletal: No deformities, no cyanosis or clubbing ?Neuro:  alert, non focal, no tremors ? ? ? ?   ?Assessment & Plan:  ? ? ?

## 2022-03-15 ENCOUNTER — Telehealth: Payer: Self-pay | Admitting: Pulmonary Disease

## 2022-03-15 NOTE — Telephone Encounter (Signed)
Printed. Faxed. Called janice to tell her I was faxing office note to her. No questions noted. Nothing further needed  ?

## 2022-04-03 ENCOUNTER — Telehealth: Payer: Self-pay | Admitting: Pulmonary Disease

## 2022-04-04 NOTE — Telephone Encounter (Signed)
Called and spoke with janice. She stated that she needs to know if the patient needs to switch to the bipap.  ? ?RA, please advise.  ?

## 2022-04-04 NOTE — Telephone Encounter (Signed)
Called and spoke to [place]. Confirmed that split study was ordered on 3/28 and wants to know if sleep study can be done soon. She states patient has paid on ventilator since Dec 2022 and she is concerned it will be taken from him.  ? ?PCCs, who can I call to help get patient scheduled for split study at ?  ?

## 2022-04-10 NOTE — Telephone Encounter (Signed)
Study can not be done at [facility] or [place] location due to weight limit of 480.  I faxed referral/records to [facility] sleep lab location.  I called pt & made him aware he will be getting a call from them to schedule.  Nothing further needed.  ?

## 2022-04-13 ENCOUNTER — Telehealth: Payer: Self-pay | Admitting: Pulmonary Disease

## 2022-04-13 NOTE — Telephone Encounter (Signed)
Called and spoke with patient. He is ok with [place] sending his last OV notes to [place]. Advised I would fax the notes.  ? ?Called Vicky to get a fax number but she was not available. Will call back next week.  ?

## 2022-05-30 ENCOUNTER — Ambulatory Visit: Payer: No Typology Code available for payment source | Attending: Pulmonary Disease

## 2022-05-30 DIAGNOSIS — E662 Morbid (severe) obesity with alveolar hypoventilation: Secondary | ICD-10-CM | POA: Diagnosis not present

## 2022-05-30 DIAGNOSIS — Z6841 Body Mass Index (BMI) 40.0 and over, adult: Secondary | ICD-10-CM | POA: Diagnosis not present

## 2022-05-30 DIAGNOSIS — G4733 Obstructive sleep apnea (adult) (pediatric): Secondary | ICD-10-CM | POA: Diagnosis present

## 2022-06-01 ENCOUNTER — Telehealth (INDEPENDENT_AMBULATORY_CARE_PROVIDER_SITE_OTHER): Payer: No Typology Code available for payment source | Admitting: Pulmonary Disease

## 2022-06-01 DIAGNOSIS — G4733 Obstructive sleep apnea (adult) (pediatric): Secondary | ICD-10-CM

## 2022-06-01 DIAGNOSIS — E662 Morbid (severe) obesity with alveolar hypoventilation: Secondary | ICD-10-CM

## 2022-06-01 DIAGNOSIS — J9611 Chronic respiratory failure with hypoxia: Secondary | ICD-10-CM

## 2022-06-01 NOTE — Telephone Encounter (Signed)
Please let patient know -BiPAP 23/17 was required with 3.5 L of oxygen blended in.  Okay to send prescription for auto BiPAP -EPAP minimum 10 cm, pressure support +6, IPAP maximum 25 cm with 3.5 L oxygen blended in   -Also sent prescription to [facility]

## 2022-06-04 DIAGNOSIS — G4733 Obstructive sleep apnea (adult) (pediatric): Secondary | ICD-10-CM | POA: Diagnosis not present

## 2022-06-04 DIAGNOSIS — E662 Morbid (severe) obesity with alveolar hypoventilation: Secondary | ICD-10-CM

## 2022-06-04 DIAGNOSIS — J9611 Chronic respiratory failure with hypoxia: Secondary | ICD-10-CM | POA: Diagnosis not present

## 2022-06-04 DIAGNOSIS — J9612 Chronic respiratory failure with hypercapnia: Secondary | ICD-10-CM

## 2022-06-04 NOTE — Telephone Encounter (Signed)
Called and went over results with patient. Confirmed DME. Nothing further needed. Orders placed and [facility] note updated

## 2022-06-07 ENCOUNTER — Telehealth: Payer: Self-pay | Admitting: Pulmonary Disease

## 2022-06-15 ENCOUNTER — Other Ambulatory Visit: Payer: Self-pay | Admitting: Pulmonary Disease

## 2022-06-25 ENCOUNTER — Ambulatory Visit: Payer: No Typology Code available for payment source | Admitting: Pulmonary Disease

## 2022-08-10 ENCOUNTER — Encounter: Payer: Self-pay | Admitting: Pulmonary Disease

## 2022-08-10 ENCOUNTER — Ambulatory Visit (INDEPENDENT_AMBULATORY_CARE_PROVIDER_SITE_OTHER): Payer: No Typology Code available for payment source | Admitting: Pulmonary Disease

## 2022-08-10 VITALS — BP 136/86 | HR 82 | Temp 97.9°F | Ht 74.0 in | Wt >= 6400 oz

## 2022-08-10 DIAGNOSIS — G4733 Obstructive sleep apnea (adult) (pediatric): Secondary | ICD-10-CM

## 2022-08-10 DIAGNOSIS — Z6841 Body Mass Index (BMI) 40.0 and over, adult: Secondary | ICD-10-CM

## 2022-08-10 DIAGNOSIS — J9611 Chronic respiratory failure with hypoxia: Secondary | ICD-10-CM

## 2022-08-10 DIAGNOSIS — E662 Morbid (severe) obesity with alveolar hypoventilation: Secondary | ICD-10-CM | POA: Diagnosis not present

## 2022-08-10 DIAGNOSIS — J9612 Chronic respiratory failure with hypercapnia: Secondary | ICD-10-CM

## 2022-08-10 NOTE — Assessment & Plan Note (Signed)
I will refer him to the weight and wellness program in [place].  He is super morbidly obese and likely higher BMI than would be eligible for bariatric surgery at this time

## 2022-08-10 NOTE — Assessment & Plan Note (Signed)
Continue BiPAP during sleep.  Continue oxygen blended into BiPAP NIV has been discontinued

## 2022-08-10 NOTE — Progress Notes (Signed)
   Subjective:    Patient ID: Hunter King, male    DOB: 03/26/1966, 56 y.o.   MRN: [id number]  HPI 55-yo for FU of chronic hypoxic respiratory failure and OSA/OHS   Hosp admit 07/2021 -fall & passed out , hypoxic 87 % RA ABG 7.3 9/55/92 Spirometry >> severe restriction Placed on BiPAP, dc'd on oxygen & Trilogy machine , wt decreased from 600 to 507 with diuresis  PMH -agoraphobia/severe anxiety, depression, lymphedema, morbid obesity  He smoked about 10 pack years and quit in his 30s   Chief Complaint  Patient presents with   Follow-up    Bipap working well. Mask is bothering patient     After his sleep study we placed him on a BiPAP machine which she is tolerating well.  Unfortunately fullface mask is leaving a pressure mark on nasal bridge.  He is feeling rested in the daytime, no somnolence.  He does report some memory issues. Compliant with oxygen during sleep he is able to stay off oxygen for most of the day. He arrives at 92% after ambulation with a walker Compliant with diuretic 40 mg of Lasix twice daily Significant tests/ events reviewed   05/2022 split study >> BiPAP 23/17 + 3.5 L O2 >> autoBiPAP 02/2022 ambulation-desat to 83% , required 4 L of oxygen to maintain saturation Spirometry 02/2022 showed severe restriction with ratio 83, FEV1 42%, FVC 39% Spirometry 07/2021 >> severe restriction, ratio 72, FEV1 31%, FVC 33%   CTA chest 07/2021 >>Ground-glass opacities in the upper lobes in a mosaic pattern   Review of Systems neg for any significant sore throat, dysphagia, itching, sneezing, nasal congestion or excess/ purulent secretions, fever, chills, sweats, unintended wt loss, pleuritic or exertional cp, hempoptysis, orthopnea pnd or change in chronic leg swelling. Also denies presyncope, palpitations, heartburn, abdominal pain, nausea, vomiting, diarrhea or change in bowel or urinary habits, dysuria,hematuria, rash, arthralgias, visual complaints, headache, numbness  weakness or ataxia.     Objective:   Physical Exam Gen. Pleasant, obese, in no distress ENT - no lesions, no post nasal drip Neck: No JVD, no thyromegaly, no carotid bruits Lungs: no use of accessory muscles, no dullness to percussion, decreased without rales or rhonchi  Cardiovascular: Rhythm regular, heart sounds  normal, no murmurs or gallops, no peripheral edema Musculoskeletal: No deformities, no cyanosis or clubbing , no tremors        Assessment & Plan:

## 2022-08-10 NOTE — Assessment & Plan Note (Signed)
Auto BiPAP download was reviewed which shows excellent control of events and great compliance more than 7 hours per night with average bilevel settings of 18/12 cm.  BiPAP is only helped improve his daytime somnolence and fatigue. He is very compliant. For the pressure mark over the bridge of his nose, I have recommended an AirFit F30 fullface mask.  Weight loss encouraged, compliance with goal of at least 4-6 hrs every night is the expectation. Advised against medications with sedative side effects Cautioned against driving when sleepy - understanding that sleepiness will vary on a day to day basis

## 2022-08-10 NOTE — Patient Instructions (Addendum)
   X Rx for  airfit F 30 full face mask  BiPAP is working well - avg pressure is 18/12 cm  X referral to weight & wellness clinic

## 2022-11-29 IMAGING — DX DG KNEE COMPLETE 4+V*L*
5 series · 5 of 5 positions shown · non-contrast
Comparison: Knee radiographs 05/30/2006

CLINICAL DATA: Fall

EXAM:
LEFT KNEE - COMPLETE 4+ VIEW

[knee ap (1 of 2)]
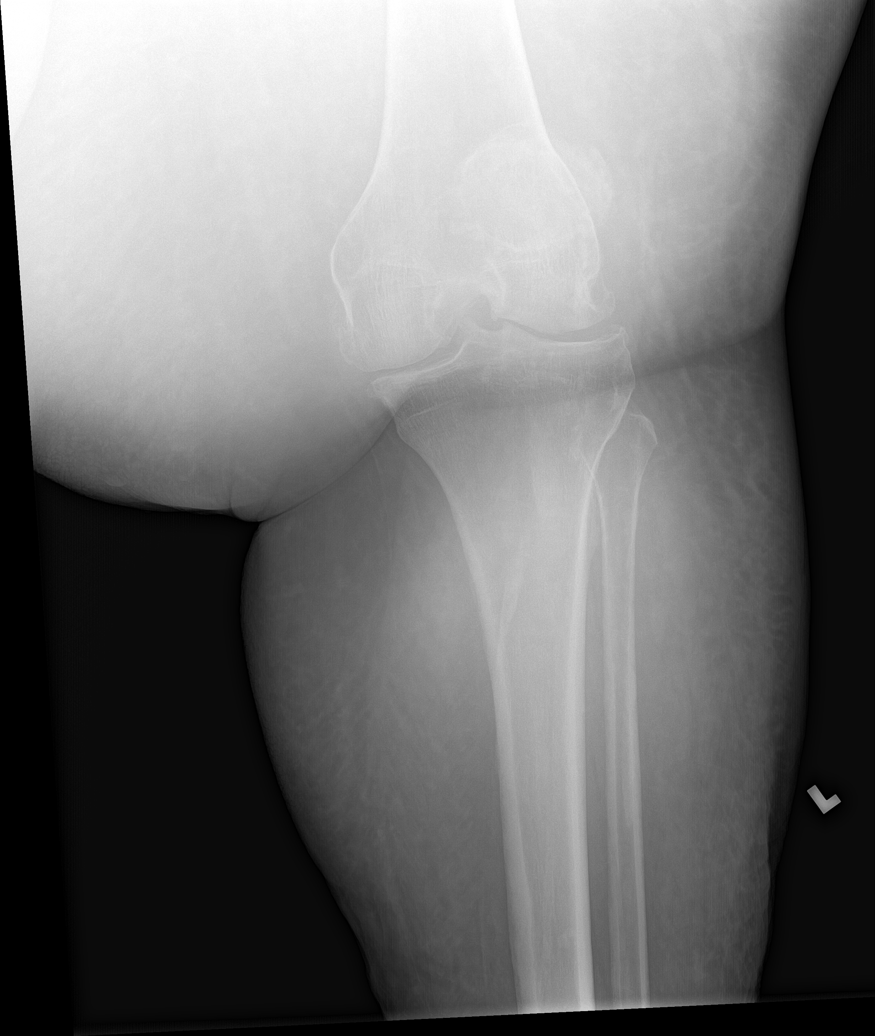

[knee tunnel]
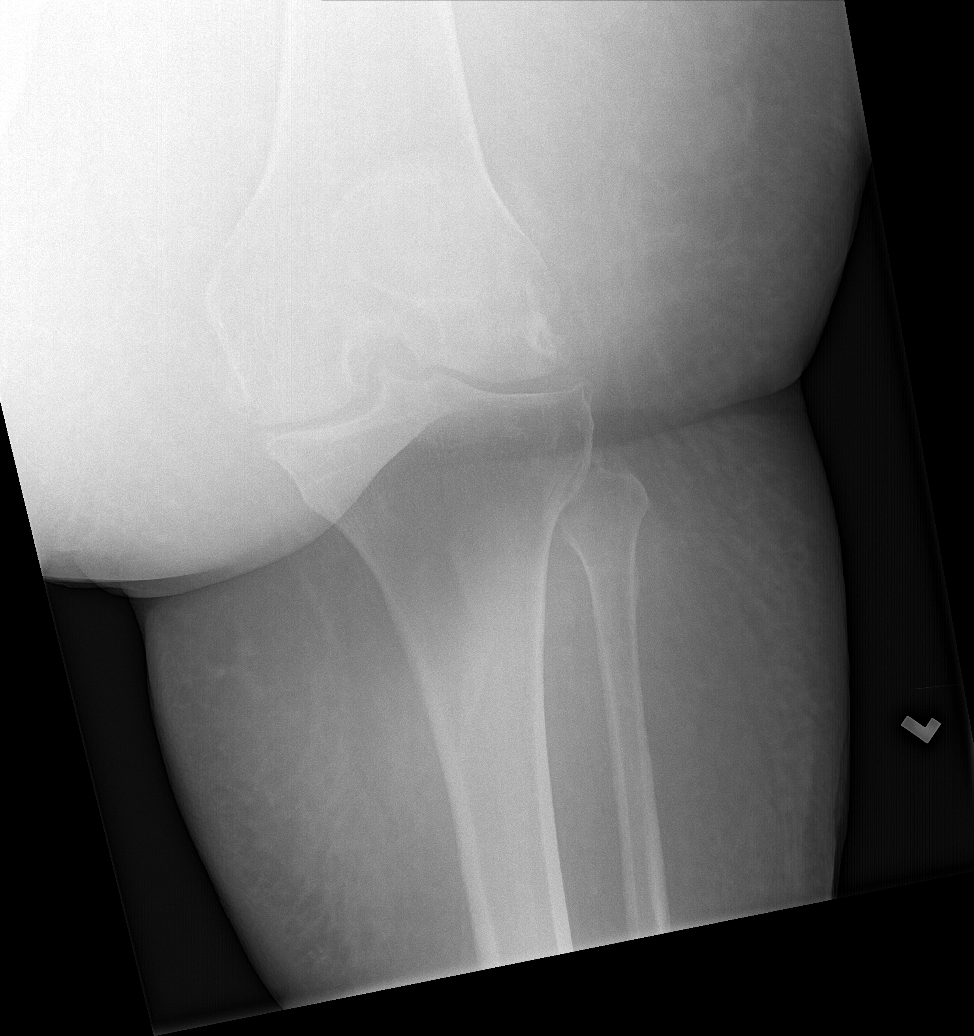

[knee lat]
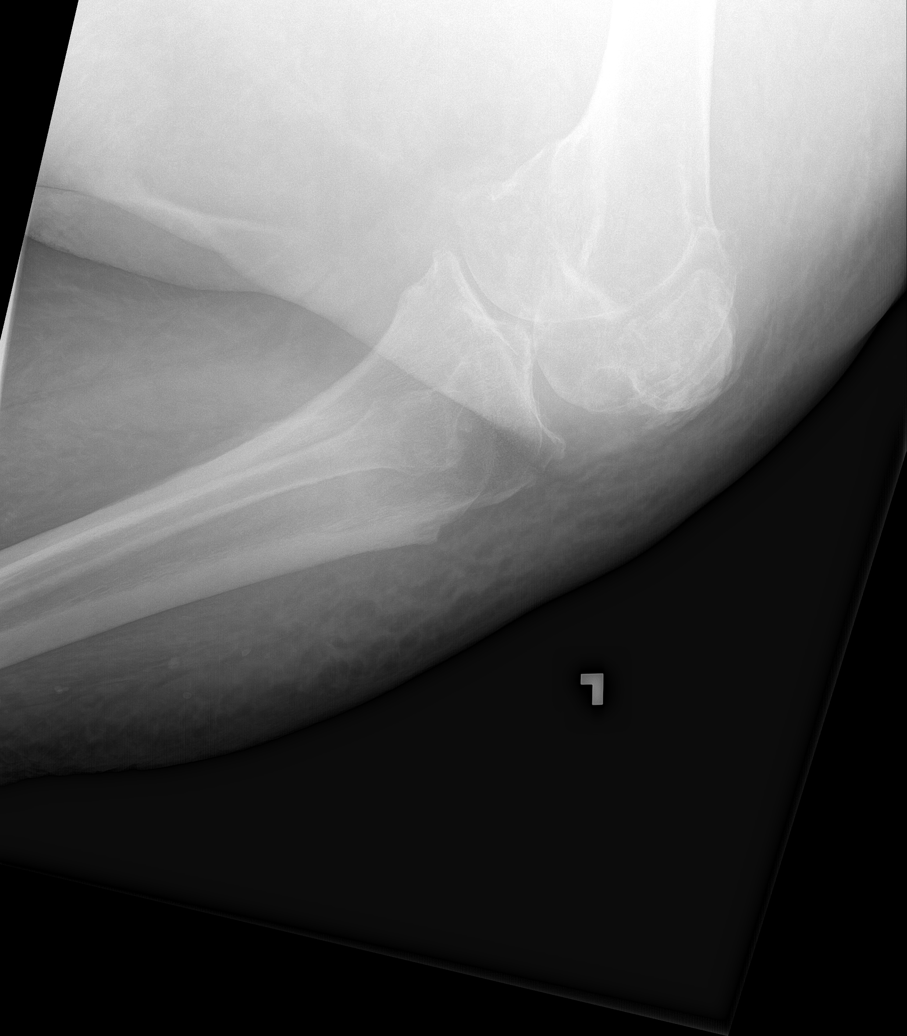

[knee obl]
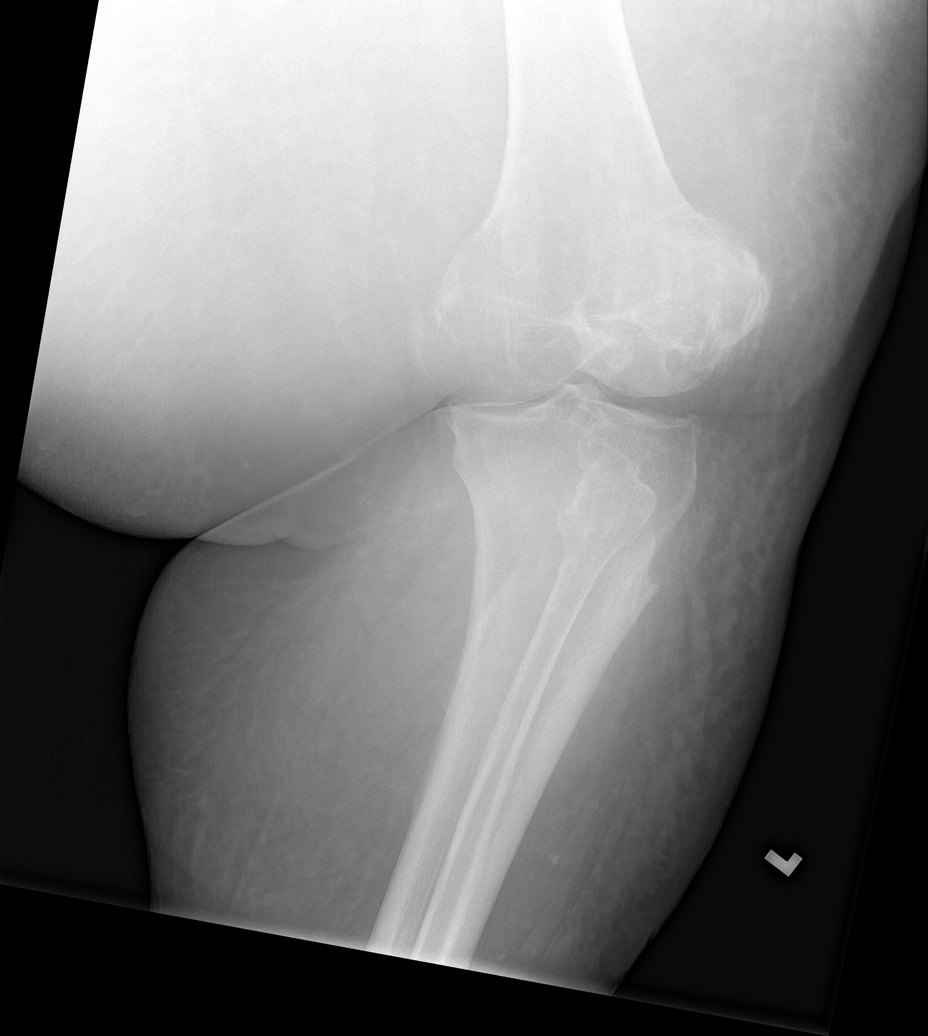

[knee ap (2 of 2)]
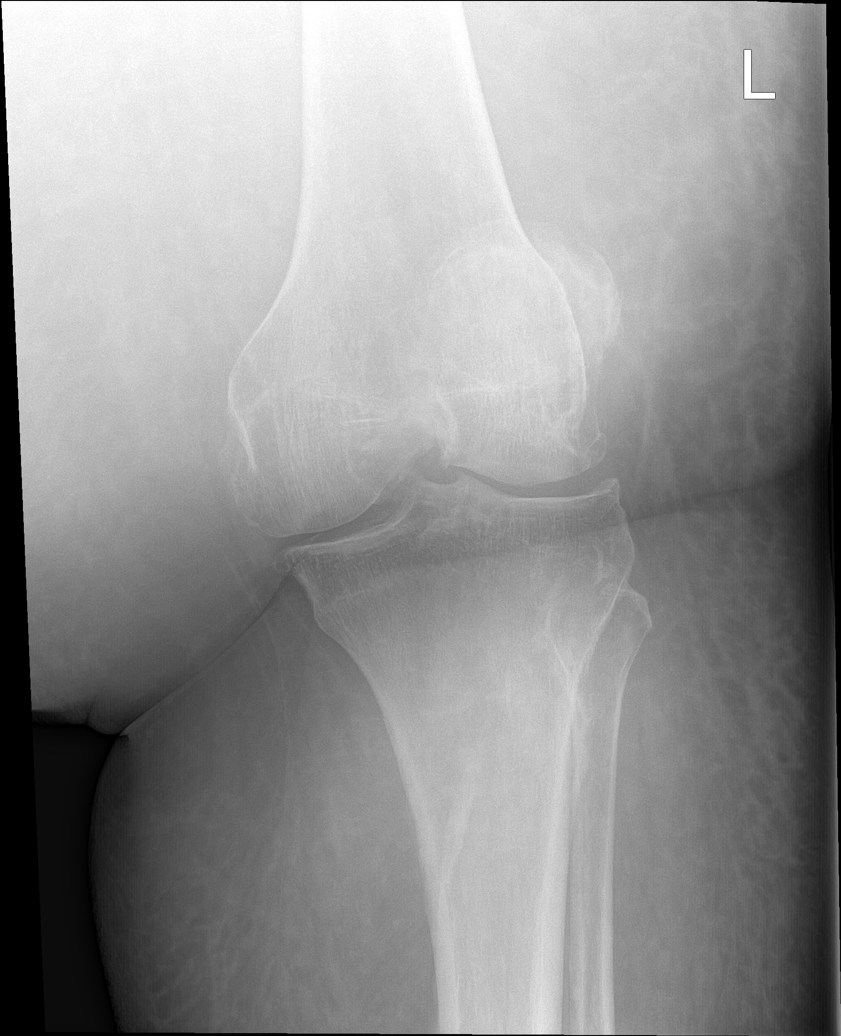

[5 of 5 positions shown; findings below may reference images not displayed]

FINDINGS: Study quality is degraded due to suboptimal patient positioning
particularly on the lateral projection.

There is no acute fracture or dislocation. Alignment is normal on
the [facility] images. There is moderate medial and mild lateral
tibiofemoral joint space narrowing with associated subchondral
sclerosis and osteophytosis. A suprasellar effusion cannot be
excluded due to positioning.
IMPRESSION: No fracture or dislocation, within the confines of suboptimal
patient positioning.

## 2022-11-30 IMAGING — CR DG CHEST 1V
1 series · 1 of 1 positions shown · non-contrast
Comparison: None.

CLINICAL DATA: Dyspnea.  Leg swelling.

EXAM:
CHEST  1 VIEW

[chest ap]
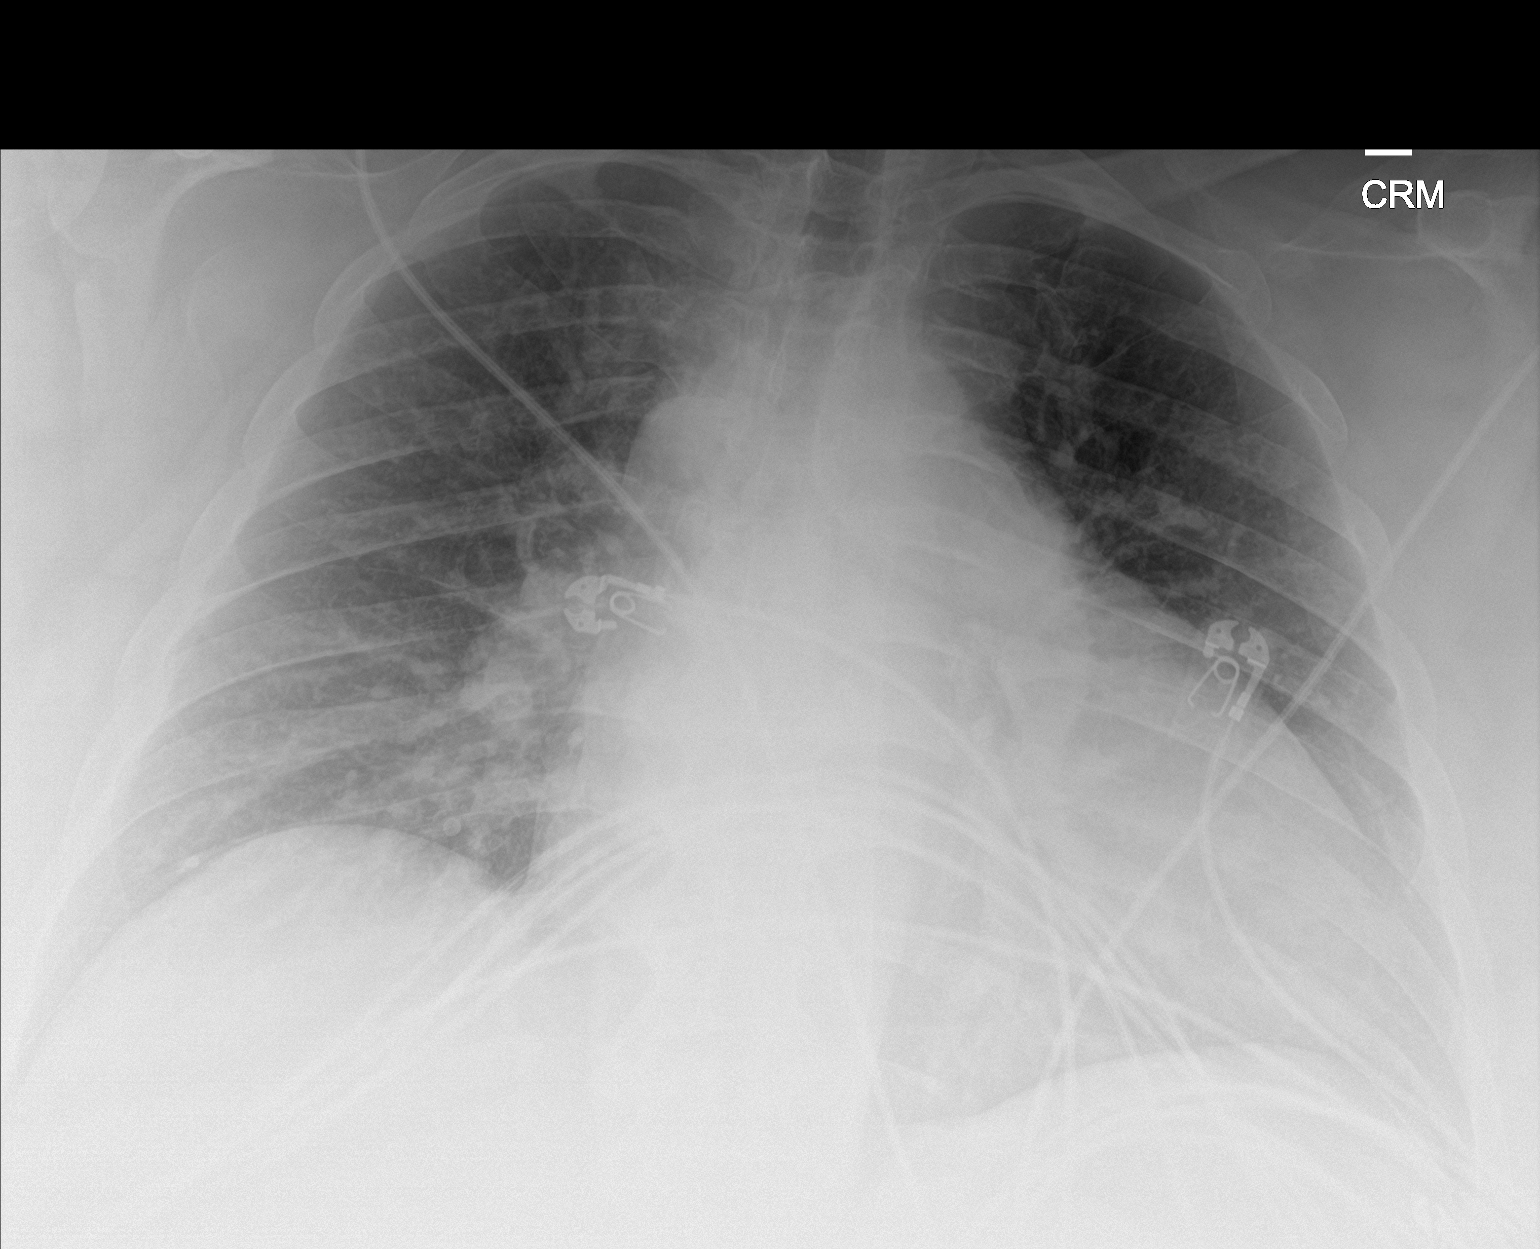

[1 of 1 positions shown; findings below may reference images not displayed]

FINDINGS: Cardiac enlargement. No pleural effusion or edema. No airspace
opacities identified. The visualized osseous structures are
unremarkable.
IMPRESSION: 1. Cardiac enlargement.
2. No acute findings.

## 2022-11-30 IMAGING — CT CT ANGIO CHEST
2 of 6 series · 17 of 46 positions shown · IV contrast (APPLIED)
Comparison: None.

CLINICAL DATA: Concern for pulmonary embolism.  Short of breath.

EXAM:
CT ANGIOGRAPHY CHEST WITH CONTRAST
TECHNIQUE: Multidetector CT imaging of the chest was performed using the
standard protocol during bolus administration of intravenous
contrast. Multiplanar CT image reconstructions and MIPs were
obtained to evaluate the vascular anatomy.
CONTRAST:  100mL OMNIPAQUE IOHEXOL 350 MG/ML SOLN

[Series 6: thins · axial · 0.98mm/px · z∈[-1048,-796]mm · 14 of 276 slices shown]
[im 12/276  lung]
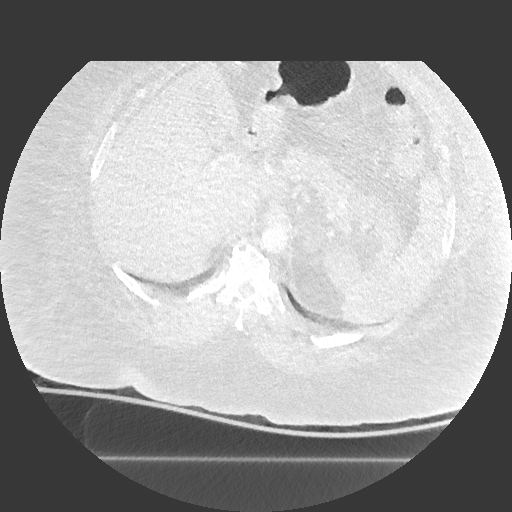
[im 36/276  soft-tissue]
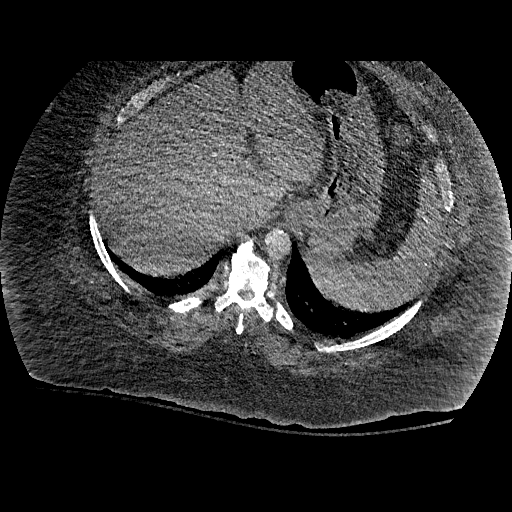
[im 48/276  lung]
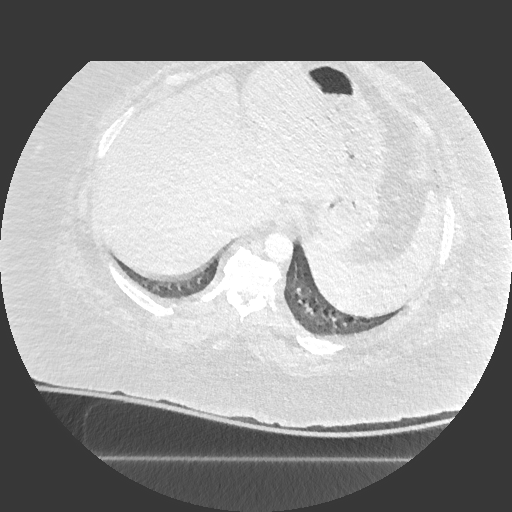
[im 72/276  soft-tissue]
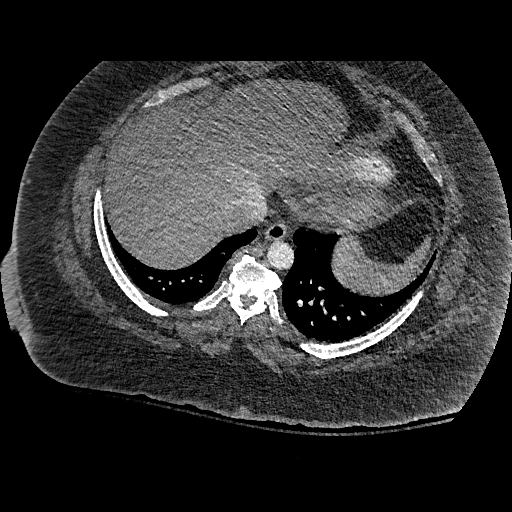
[im 96/276  lung]
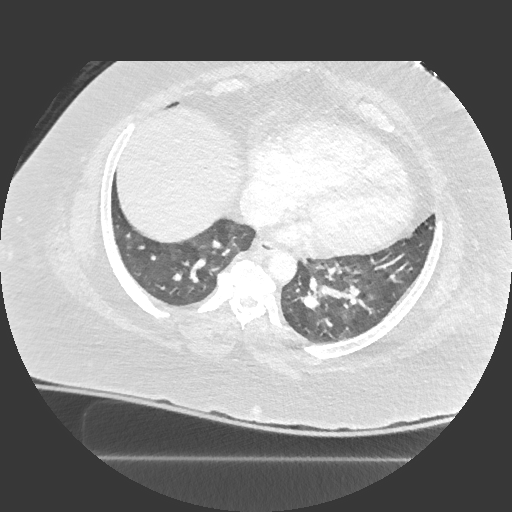
[im 108/276  soft-tissue]
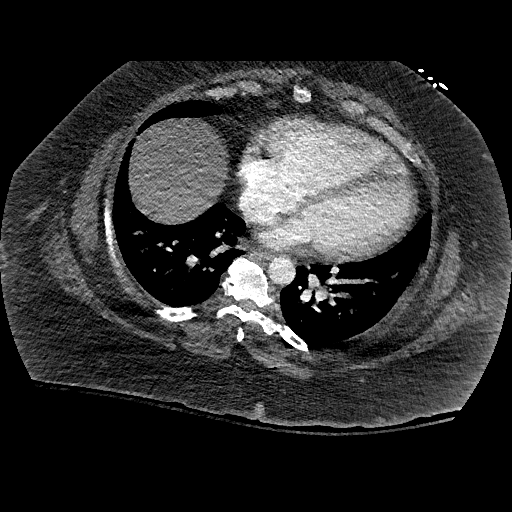
[im 132/276  lung]
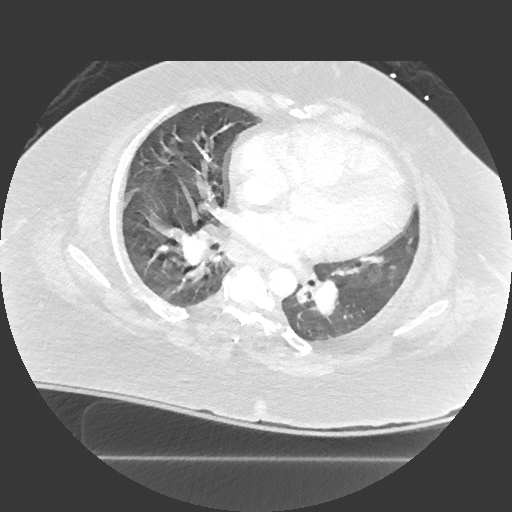
[im 144/276  soft-tissue]
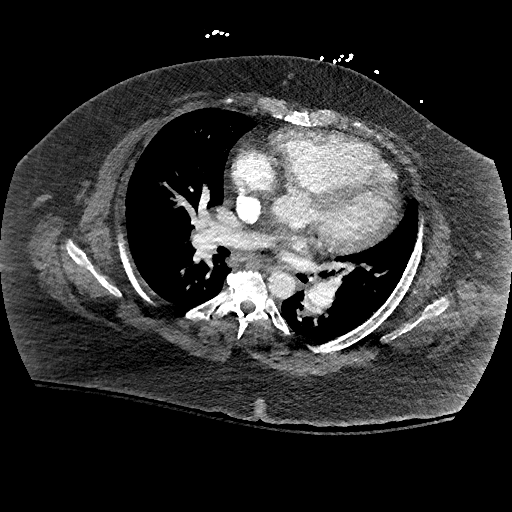
[im 168/276  lung]
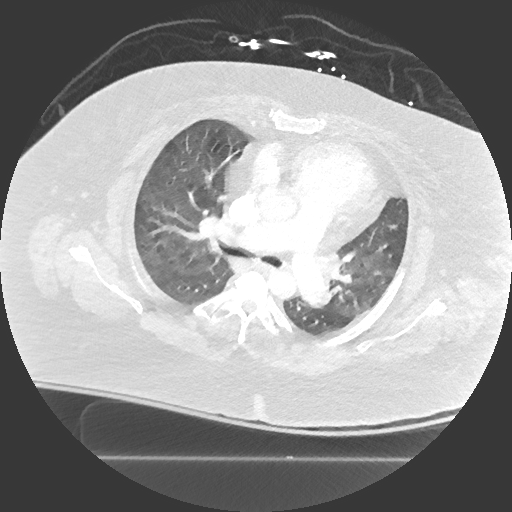
[im 180/276  soft-tissue]
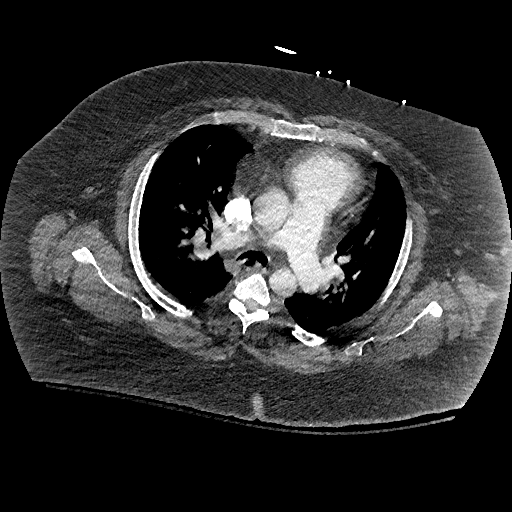
[im 204/276  lung]
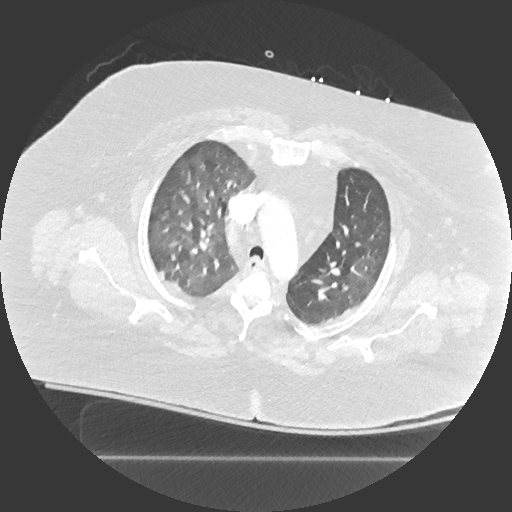
[im 228/276  soft-tissue]
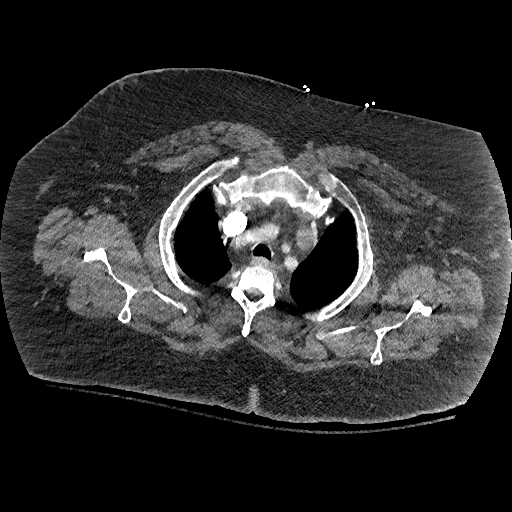
[im 240/276  lung]
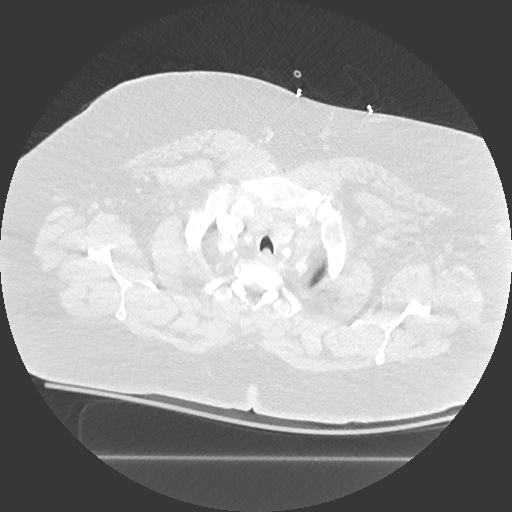
[im 264/276  soft-tissue]
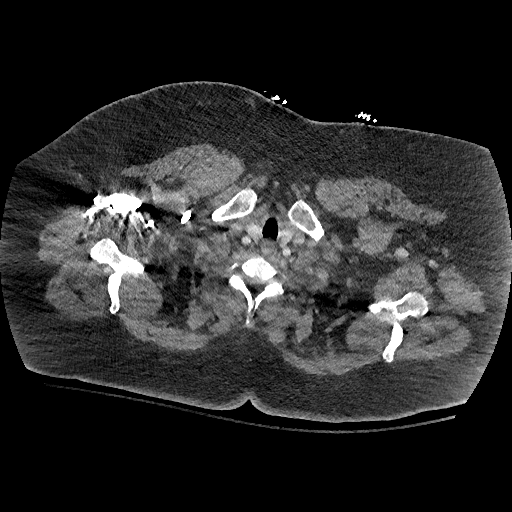

[Series 8: coronal mpr · coronal · 0.54mm/px · 3 of 200 slices shown]
[im 50/200  soft-tissue]
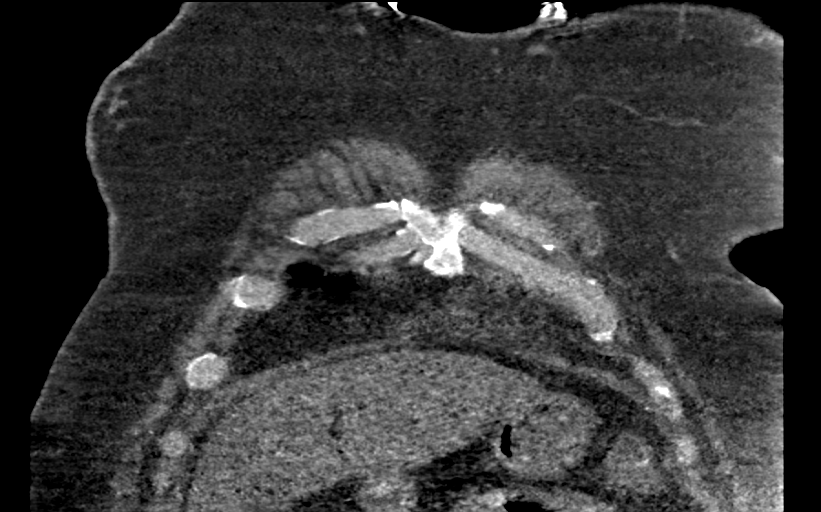
[im 100/200  soft-tissue]
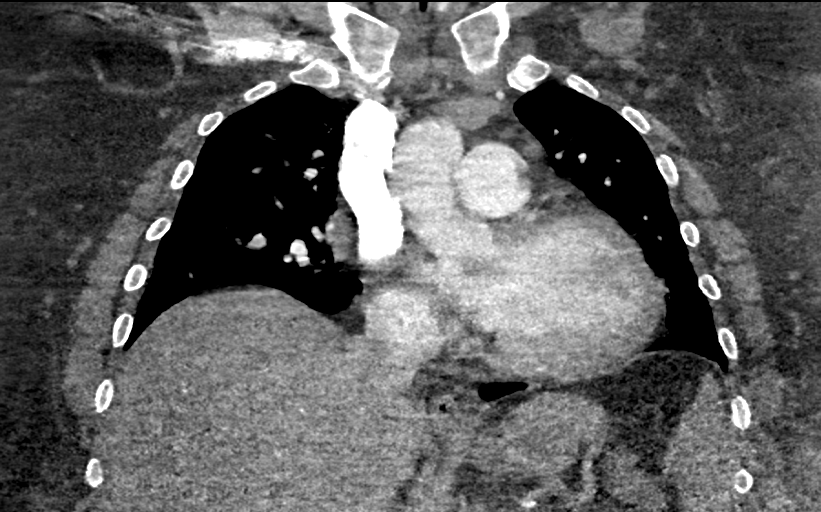
[im 150/200  soft-tissue]
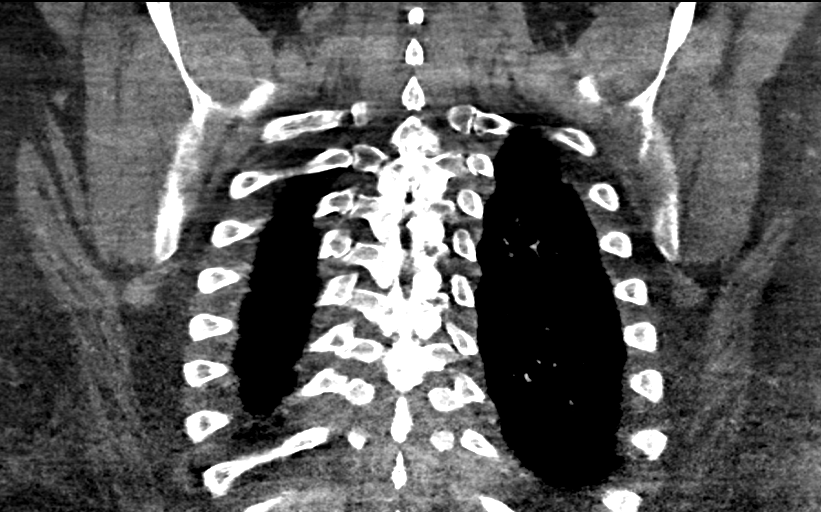

[17 of 46 positions shown; findings below may reference images not displayed]

FINDINGS: Cardiovascular: No filling defects within the pulmonary arteries to
suggest acute pulmonary embolism. Mild moderate image degradation
related to body habitus and respiratory motion.

Mediastinum/Nodes: No axillary or supraclavicular adenopathy. 15 mm
lymph node adjacent to the bronchus intermedius (image 48/5) no
pericardial fluid. Esophagus normal.

Lungs/Pleura: No pulmonary infarction. No pneumonia. Ground-glass
opacities in the upper lobes in a mosaic pattern. No pleural fluid.
No pneumothorax.

Upper Abdomen: Limited view of the liver, kidneys, pancreas are
unremarkable. Normal adrenal glands.

Musculoskeletal: No aggressive osseous lesion. Degenerative
osteophytosis of the spine.

Review of the MIP images confirms the above findings.
IMPRESSION: 1. No evidence acute pulmonary embolism. Mild-to-moderate image
degradation as above.
2. Mild diffuse ground-glass opacities suggest atelectasis or mild
edema.
3. No pleural fluid or pneumonia.
4. Single subcarinal node is favored reactive.

## 2023-11-21 DIAGNOSIS — F419 Anxiety disorder, unspecified: Secondary | ICD-10-CM | POA: Diagnosis not present

## 2023-11-21 DIAGNOSIS — Z6841 Body Mass Index (BMI) 40.0 and over, adult: Secondary | ICD-10-CM | POA: Diagnosis not present

## 2023-11-21 DIAGNOSIS — R7309 Other abnormal glucose: Secondary | ICD-10-CM | POA: Diagnosis not present

## 2023-11-21 DIAGNOSIS — E213 Hyperparathyroidism, unspecified: Secondary | ICD-10-CM | POA: Diagnosis not present

## 2023-11-21 DIAGNOSIS — I890 Lymphedema, not elsewhere classified: Secondary | ICD-10-CM | POA: Diagnosis not present

## 2023-11-21 DIAGNOSIS — R2689 Other abnormalities of gait and mobility: Secondary | ICD-10-CM | POA: Diagnosis not present

## 2023-11-21 DIAGNOSIS — I10 Essential (primary) hypertension: Secondary | ICD-10-CM | POA: Diagnosis not present

## 2023-11-21 DIAGNOSIS — J9611 Chronic respiratory failure with hypoxia: Secondary | ICD-10-CM | POA: Diagnosis not present

## 2024-01-15 DIAGNOSIS — J9611 Chronic respiratory failure with hypoxia: Secondary | ICD-10-CM | POA: Diagnosis not present

## 2024-01-15 DIAGNOSIS — R2689 Other abnormalities of gait and mobility: Secondary | ICD-10-CM | POA: Diagnosis not present

## 2024-05-15 DIAGNOSIS — G4700 Insomnia, unspecified: Secondary | ICD-10-CM | POA: Diagnosis not present

## 2024-05-15 DIAGNOSIS — Z6841 Body Mass Index (BMI) 40.0 and over, adult: Secondary | ICD-10-CM | POA: Diagnosis not present

## 2024-05-15 DIAGNOSIS — Z008 Encounter for other general examination: Secondary | ICD-10-CM | POA: Diagnosis not present

## 2024-05-15 DIAGNOSIS — F17211 Nicotine dependence, cigarettes, in remission: Secondary | ICD-10-CM | POA: Diagnosis not present

## 2024-05-15 DIAGNOSIS — Z9981 Dependence on supplemental oxygen: Secondary | ICD-10-CM | POA: Diagnosis not present

## 2024-05-15 DIAGNOSIS — G4733 Obstructive sleep apnea (adult) (pediatric): Secondary | ICD-10-CM | POA: Diagnosis not present

## 2024-05-15 DIAGNOSIS — F3341 Major depressive disorder, recurrent, in partial remission: Secondary | ICD-10-CM | POA: Diagnosis not present

## 2024-05-15 DIAGNOSIS — E119 Type 2 diabetes mellitus without complications: Secondary | ICD-10-CM | POA: Diagnosis not present
